# Patient Record
Sex: Female | Born: 1970 | Hispanic: Refuse to answer | Marital: Married | Smoking: Current some day smoker
Health system: Southern US, Community
[De-identification: ages and names within clinical notes are randomized; demographics above are authoritative.]

## PROBLEM LIST (undated history)

## (undated) DIAGNOSIS — K219 Gastro-esophageal reflux disease without esophagitis: Secondary | ICD-10-CM

## (undated) DIAGNOSIS — E785 Hyperlipidemia, unspecified: Secondary | ICD-10-CM

## (undated) DIAGNOSIS — D649 Anemia, unspecified: Secondary | ICD-10-CM

## (undated) HISTORY — DX: Gastro-esophageal reflux disease without esophagitis: K21.9

## (undated) HISTORY — DX: Hyperlipidemia, unspecified: E78.5

## (undated) HISTORY — DX: Anemia, unspecified: D64.9

## (undated) HISTORY — PX: OTHER SURGICAL HISTORY: SHX169

---

## 2002-04-27 HISTORY — PX: CHOLECYSTECTOMY: SHX55

## 2016-04-27 HISTORY — PX: SLEEVE GASTROPLASTY: SHX1101

## 2019-10-09 ENCOUNTER — Encounter: Payer: Self-pay | Admitting: Family Medicine

## 2019-10-09 ENCOUNTER — Ambulatory Visit (INDEPENDENT_AMBULATORY_CARE_PROVIDER_SITE_OTHER): Payer: Managed Care, Other (non HMO) | Admitting: Family Medicine

## 2019-10-09 ENCOUNTER — Other Ambulatory Visit: Payer: Self-pay

## 2019-10-09 VITALS — BP 132/80 | HR 76 | Temp 98.2°F | Ht 63.0 in | Wt 193.6 lb

## 2019-10-09 DIAGNOSIS — I10 Essential (primary) hypertension: Secondary | ICD-10-CM | POA: Insufficient documentation

## 2019-10-09 DIAGNOSIS — E039 Hypothyroidism, unspecified: Secondary | ICD-10-CM

## 2019-10-09 DIAGNOSIS — G43009 Migraine without aura, not intractable, without status migrainosus: Secondary | ICD-10-CM | POA: Insufficient documentation

## 2019-10-09 DIAGNOSIS — F339 Major depressive disorder, recurrent, unspecified: Secondary | ICD-10-CM

## 2019-10-09 DIAGNOSIS — Z975 Presence of (intrauterine) contraceptive device: Secondary | ICD-10-CM | POA: Insufficient documentation

## 2019-10-09 DIAGNOSIS — Z9884 Bariatric surgery status: Secondary | ICD-10-CM | POA: Diagnosis not present

## 2019-10-09 DIAGNOSIS — F411 Generalized anxiety disorder: Secondary | ICD-10-CM

## 2019-10-09 HISTORY — DX: Hypothyroidism, unspecified: E03.9

## 2019-10-09 HISTORY — DX: Major depressive disorder, recurrent, unspecified: F33.9

## 2019-10-09 HISTORY — DX: Essential (primary) hypertension: I10

## 2019-10-09 HISTORY — DX: Migraine without aura, not intractable, without status migrainosus: G43.009

## 2019-10-09 LAB — LIPID PANEL
Cholesterol: 212 mg/dL — ABNORMAL HIGH (ref 0–200)
HDL: 46.9 mg/dL (ref >39.00)
LDL Cholesterol: 142 mg/dL — ABNORMAL HIGH (ref 0–99)
NonHDL: 165.54
Total CHOL/HDL Ratio: 5
Triglycerides: 116 mg/dL (ref 0.0–149.0)
VLDL: 23.2 mg/dL (ref 0.0–40.0)

## 2019-10-09 LAB — CBC WITH DIFFERENTIAL/PLATELET
Basophils Absolute: 0 10*3/uL (ref 0.0–0.1)
Basophils Relative: 0.6 % (ref 0.0–3.0)
Eosinophils Absolute: 0.2 10*3/uL (ref 0.0–0.7)
Eosinophils Relative: 3.4 % (ref 0.0–5.0)
HCT: 42.3 % (ref 36.0–46.0)
Hemoglobin: 14.5 g/dL (ref 12.0–15.0)
Lymphocytes Relative: 41.7 % (ref 12.0–46.0)
Lymphs Abs: 2.1 10*3/uL (ref 0.7–4.0)
MCHC: 34.3 g/dL (ref 30.0–36.0)
MCV: 84.4 fl (ref 78.0–100.0)
Monocytes Absolute: 0.4 10*3/uL (ref 0.1–1.0)
Monocytes Relative: 8.4 % (ref 3.0–12.0)
Neutro Abs: 2.3 10*3/uL (ref 1.4–7.7)
Neutrophils Relative %: 45.9 % (ref 43.0–77.0)
Platelets: 269 10*3/uL (ref 150.0–400.0)
RBC: 5.01 Mil/uL (ref 3.87–5.11)
RDW: 13.7 % (ref 11.5–15.5)
WBC: 5 10*3/uL (ref 4.0–10.5)

## 2019-10-09 LAB — COMPREHENSIVE METABOLIC PANEL
ALT: 11 U/L (ref 0–35)
AST: 13 U/L (ref 0–37)
Albumin: 4.2 g/dL (ref 3.5–5.2)
Alkaline Phosphatase: 37 U/L — ABNORMAL LOW (ref 39–117)
BUN: 9 mg/dL (ref 6–23)
CO2: 27 mEq/L (ref 19–32)
Calcium: 9.3 mg/dL (ref 8.4–10.5)
Chloride: 106 mEq/L (ref 96–112)
Creatinine, Ser: 0.88 mg/dL (ref 0.40–1.20)
GFR: 68.57 mL/min (ref >60.00)
Glucose, Bld: 88 mg/dL (ref 70–99)
Potassium: 4.2 mEq/L (ref 3.5–5.1)
Sodium: 138 mEq/L (ref 135–145)
Total Bilirubin: 0.4 mg/dL (ref 0.2–1.2)
Total Protein: 7 g/dL (ref 6.0–8.3)

## 2019-10-09 LAB — TSH: TSH: 0.63 u[IU]/mL (ref 0.35–4.50)

## 2019-10-09 LAB — VITAMIN B12: Vitamin B-12: 353 pg/mL (ref 211–911)

## 2019-10-09 LAB — VITAMIN D 25 HYDROXY (VIT D DEFICIENCY, FRACTURES): VITD: 54.08 ng/mL (ref 30.00–100.00)

## 2019-10-09 MED ORDER — AMLODIPINE BESYLATE 10 MG PO TABS: 10.0000 mg | ORAL_TABLET | Freq: Every day | ORAL | 3 refills | Status: DC

## 2019-10-09 MED ORDER — LEVOTHYROXINE SODIUM 50 MCG PO TABS: 50.0000 ug | ORAL_TABLET | Freq: Every day | ORAL | 3 refills | Status: DC

## 2019-10-09 MED ORDER — CLORAZEPATE DIPOTASSIUM 15 MG PO TABS: 15.0000 mg | ORAL_TABLET | Freq: Three times a day (TID) | ORAL | 1 refills | Status: DC | PRN

## 2019-10-09 MED ORDER — ESOMEPRAZOLE MAGNESIUM 40 MG PO CPDR: 40.0000 mg | DELAYED_RELEASE_CAPSULE | Freq: Every day | ORAL | 3 refills | Status: DC

## 2019-10-09 MED ORDER — LISINOPRIL 10 MG PO TABS: 10.0000 mg | ORAL_TABLET | Freq: Every day | ORAL | 3 refills | Status: DC

## 2019-10-09 NOTE — Progress Notes (Signed)
Subjective  CC:  Chief Complaint  Patient presents with  . Establish Care    HPI: Lindsay Dennis is a 49 y.o. female who presents to [facility] at [place] today to establish care with me as a new patient.   She has the following concerns or needs:  49 yo female mother of 2 sons, married who moved from [place] about 6-7 months ago; [place].   Limited english  Overall doing well. Needs med refills:  HTN: looks like she may be on 3-4 meds: amlodipine 5, ramipril 5, prn captopril, and possibly one more but not sure what class of med. Reports good control w/o CAD  S/p gastric sleeve: not on vitamins.   GERD on nexium 40.   Low thyroid on levothyroxine.   Reports has IUD, possible a mirena IUD.   Reports had CPE last year. ? If had pap  History of migraines  C/o nail changes since going to have manicure: splitting and some discoloration.  Assessment  1. Essential hypertension   2. Major depression, recurrent, chronic (HCC)   3. Migraine without aura and without status migrainosus, not intractable   4. Acquired hypothyroidism   5. Gastric bypass status for obesity   6. IUD (intrauterine device) in place   7. GAD (generalized anxiety disorder)      Plan     HTN: good control but need to adjust meds: start lisinopril and amlodipine. Then follow readings and adjust from there. Consider CCB given migraines. Consider HCTZ  Depression/anxiety is controlled on zoloft 100 and benzo. Prn use. Uses therapy dog as well.   Low thyroid. Recheck levels today  Gastric bypass: check for vit and iron deficiencies  IUD in place. Return for pap/cpe  Migraines: reportedly controlled  Follow up:  No follow-ups on file. Orders Placed This Encounter  Procedures  . CBC with Differential/Platelet  . Comprehensive metabolic panel  . Lipid panel  . TSH  . VITAMIN D 25 Hydroxy (Vit-D Deficiency, Fractures)  . Vitamin B12  .  Iron, TIBC and Ferritin Panel   Meds ordered this encounter  Medications  . amLODipine (NORVASC) 10 MG tablet    Sig: Take 1 tablet (10 mg total) by mouth daily.    Dispense:  90 tablet    Refill:  3  . lisinopril (ZESTRIL) 10 MG tablet    Sig: Take 1 tablet (10 mg total) by mouth daily.    Dispense:  90 tablet    Refill:  3  . esomeprazole (NEXIUM) 40 MG capsule    Sig: Take 1 capsule (40 mg total) by mouth daily.    Dispense:  90 capsule    Refill:  3  . clorazepate (TRANXENE) 15 MG tablet    Sig: Take 1 tablet (15 mg total) by mouth 3 (three) times daily as needed for anxiety.    Dispense:  90 tablet    Refill:  1  . levothyroxine (SYNTHROID) 50 MCG tablet    Sig: Take 1 tablet (50 mcg total) by mouth daily.    Dispense:  90 tablet    Refill:  3     Depression screen PHQ 2/9 10/09/2019  Decreased Interest 1  Down, Depressed, Hopeless 1  PHQ - 2 Score 2  Altered sleeping 0  Tired, decreased energy 1  Change in appetite 1  Feeling bad or failure about yourself  1  Trouble concentrating 1  Moving slowly or fidgety/restless 0  Suicidal thoughts 0  PHQ-9 Score 6    We updated and reviewed the patient's past history in detail and it is documented below.  Patient Active Problem List   Diagnosis Date Noted  . Major depression, recurrent, chronic (HCC) 10/09/2019  . Migraine headache without aura 10/09/2019  . Essential hypertension 10/09/2019  . Acquired hypothyroidism 10/09/2019  . Gastric bypass status for obesity 10/09/2019    Gastric sleeve: 2004   . IUD (intrauterine device) in place 10/09/2019    mirena placed 2018 in [place].  3rd IUD    Health Maintenance  Topic Date Due  . Hepatitis C Screening  Never done  . HIV Screening  Never done  . TETANUS/TDAP  Never done  . PAP SMEAR-Modifier  Never done  . INFLUENZA VACCINE  11/26/2019  . COVID-19 Vaccine  Completed   Immunization History  Administered Date(s) Administered  . PFIZER SARS-COV-2 Vaccination  08/18/2019, 09/08/2019   No outpatient medications have been marked as taking for the 10/09/19 encounter (Office Visit) with Willow Ora, MD.    Allergies: Patient has No Known Allergies. Past Medical History Patient  has a past medical history of Acquired hypothyroidism (10/09/2019), Essential hypertension (10/09/2019), Major depression, recurrent, chronic (HCC) (10/09/2019), and Migraine headache without aura (10/09/2019). Past Surgical History Patient  has a past surgical history that includes Sleeve Gastroplasty (2018) and Cholecystectomy (2004). Family History: Patient family history is not on file. Social History:  Patient  reports that she has quit smoking. She has never used smokeless tobacco. She reports that she does not drink alcohol and does not use drugs.  Review of Systems: Constitutional: negative for fever or malaise Ophthalmic: negative for photophobia, double vision or loss of vision Cardiovascular: negative for chest pain, dyspnea on exertion, or new LE swelling Respiratory: negative for SOB or persistent cough Gastrointestinal: negative for abdominal pain, change in bowel habits or melena Genitourinary: negative for dysuria or gross hematuria Musculoskeletal: negative for new gait disturbance or muscular weakness Integumentary: negative for new or persistent rashes Neurological: negative for TIA or stroke symptoms Psychiatric: negative for SI or delusions Allergic/Immunologic: negative for hives  Patient Care Team    Relationship Specialty Notifications Start End  Willow Ora, MD PCP - General Family Medicine  10/09/19     Objective  Vitals: BP 132/80   Pulse 76   Temp 98.2 F (36.8 C) (Temporal)   Ht 5\' 3"  (1.6 m)   Wt 193 lb 9.6 oz (87.8 kg)   SpO2 94%   BMI 34.29 kg/m  General:  Well developed, well nourished, no acute distress  Psych:  Alert and oriented,normal mood and affect HEENT:  Normocephalic, atraumatic, non-icteric sclera, supple neck  without adenopathy, mass or thyromegaly Cardiovascular:  RRR without gallop, rub or murmur Respiratory:  Good breath sounds bilaterally, CTAB with normal respiratory effort Gastrointestinal: normal bowel sounds, soft, non-tender, no noted masses. No HSM MSK: no deformities, contusions. Joints are without erythema or swelling Skin:  Warm, no rashes or suspicious lesions noted, nails with discoloration and thin. No flaking Neurologic:    Mental status is normal. Gross motor and sensory exams are normal. Normal gait   Commons side effects, risks, benefits, and alternatives for medications and treatment plan prescribed today were discussed, and the patient expressed understanding of the given instructions. Patient is instructed to call or message via MyChart if he/she has any questions or concerns regarding our treatment plan. No barriers to understanding were identified. We discussed Red Flag symptoms and  signs in detail. Patient expressed understanding regarding what to do in case of urgent or emergency type symptoms.   Medication list was reconciled, printed and provided to the patient in AVS. Patient instructions and summary information was reviewed with the patient as documented in the AVS. This note was prepared with assistance of Dragon voice recognition software. Occasional wrong-word or sound-a-like substitutions may have occurred due to the inherent limitations of voice recognition software  This visit occurred during the SARS-CoV-2 public health emergency.  Safety protocols were in place, including screening questions prior to the visit, additional usage of staff PPE, and extensive cleaning of exam room while observing appropriate contact time as indicated for disinfecting solutions.

## 2019-10-09 NOTE — Patient Instructions (Signed)
Please return in 3-4 months for physical with pap smear.   I have refilled your medications.  There are 2 medications that I am not sure of. Please let me know if you can find out an equivalent type of medication or type of medication for me.   It was a pleasure meeting you today! Thank you for choosing [place] to meet your healthcare needs! I truly look forward to working with you. If you have any questions or concerns, please send me a message via Mychart or call the office at [phone].

## 2019-10-10 LAB — IRON,TIBC AND FERRITIN PANEL
%SAT: 36 % (calc) (ref 16–45)
Ferritin: 29 ng/mL (ref 16–232)
Iron: 103 ug/dL (ref 40–190)
TIBC: 287 mcg/dL (calc) (ref 250–450)

## 2019-10-12 NOTE — Progress Notes (Signed)
Please call patient: I have reviewed his/her lab results. Her lab results look good! She can consider taking an iron pill once a day and a vit B12 daily supplement to keep levels good. No other changes are needed at this time. Follow up as directed.

## 2019-11-01 ENCOUNTER — Telehealth: Payer: Self-pay | Admitting: Family Medicine

## 2019-11-01 NOTE — Telephone Encounter (Signed)
Patients husband called and requested refill on venlafaxine (EFFEXOR) 100 MG tablet  He made an appointment to discuss her other medications as well but she only has three pills left  .Marland Kitchen LAST APPOINTMENT DATE: 10/09/2019   NEXT APPOINTMENT DATE:@7 /14/2021  MEDICATION:  PHARMACY: [id number] - [place], [place] - [address]  **Let patient know to contact pharmacy at the end of the day to make sure medication is ready. **  ** Please notify patient to allow 48-72 hours to process**  **Encourage patient to contact the pharmacy for refills or they can request refills through [facility]**  CLINICAL FILLS OUT ALL BELOW:   LAST REFILL:  QTY:  REFILL DATE:    OTHER COMMENTS:    Okay for refill?  Please advise

## 2019-11-02 ENCOUNTER — Other Ambulatory Visit: Payer: Self-pay

## 2019-11-02 MED ORDER — VENLAFAXINE HCL 100 MG PO TABS: 100.0000 mg | ORAL_TABLET | Freq: Three times a day (TID) | ORAL | 3 refills | Status: DC

## 2019-11-02 NOTE — Telephone Encounter (Signed)
Refill sent to pharmacy.   

## 2019-11-08 ENCOUNTER — Encounter: Payer: Self-pay | Admitting: Family Medicine

## 2019-11-08 ENCOUNTER — Ambulatory Visit: Payer: Managed Care, Other (non HMO) | Admitting: Family Medicine

## 2019-11-08 ENCOUNTER — Other Ambulatory Visit: Payer: Self-pay

## 2019-11-08 VITALS — BP 126/82 | Ht 63.0 in | Wt 194.0 lb

## 2019-11-08 DIAGNOSIS — Z9884 Bariatric surgery status: Secondary | ICD-10-CM

## 2019-11-08 DIAGNOSIS — E039 Hypothyroidism, unspecified: Secondary | ICD-10-CM | POA: Diagnosis not present

## 2019-11-08 DIAGNOSIS — I10 Essential (primary) hypertension: Secondary | ICD-10-CM | POA: Diagnosis not present

## 2019-11-08 DIAGNOSIS — E538 Deficiency of other specified B group vitamins: Secondary | ICD-10-CM | POA: Diagnosis not present

## 2019-11-08 MED ORDER — VITAMIN B-12 1000 MCG PO TABS
1000.0000 ug | ORAL_TABLET | Freq: Every day | ORAL | Status: DC
Start: 2019-11-08 — End: 2021-04-03

## 2019-11-08 MED ORDER — RAMIPRIL 5 MG PO CAPS: 5.0000 mg | ORAL_CAPSULE | Freq: Every day | ORAL | 2 refills | Status: DC

## 2019-11-08 NOTE — Patient Instructions (Addendum)
Please return in 4-6 weeks for blood pressure recheck.   Please check your blood pressures several times a week a write down the readings; bring in to the office for my review.   Take ramipril 5mg  and amlodipine 10mg  once a day. Do not take any other blood pressure medications.  You should also be on an over the counter vitamin B12 pill daily and once a day iron supplement.   If you have any questions or concerns, please don't hesitate to send me a message via MyChart or call the office at [phone]. Thank you for visiting with today! It's our pleasure caring for you.   Hypertension, Adult High blood pressure (hypertension) is when the force of blood pumping through the arteries is too strong. The arteries are the blood vessels that carry blood from the heart throughout the body. Hypertension forces the heart to work harder to pump blood and may cause arteries to become narrow or stiff. Untreated or uncontrolled hypertension can cause a heart attack, heart failure, a stroke, kidney disease, and other problems. A blood pressure reading consists of a higher number over a lower number. Ideally, your blood pressure should be below 120/80. The first ("top") number is called the systolic pressure. It is a measure of the pressure in your arteries as your heart beats. The second ("bottom") number is called the diastolic pressure. It is a measure of the pressure in your arteries as the heart relaxes. What are the causes? The exact cause of this condition is not known. There are some conditions that result in or are related to high blood pressure. What increases the risk? Some risk factors for high blood pressure are under your control. The following factors may make you more likely to develop this condition:  Smoking.  Having type 2 diabetes mellitus, high cholesterol, or both.  Not getting enough exercise or physical activity.  Being overweight.  Having too much fat, sugar, calories,  or salt (sodium) in your diet.  Drinking too much alcohol. Some risk factors for high blood pressure may be difficult or impossible to change. Some of these factors include:  Having chronic kidney disease.  Having a family history of high blood pressure.  Age. Risk increases with age.  Race. You may be at higher risk if you are African American.  Gender. Men are at higher risk than women before age 36. After age 48, women are at higher risk than men.  Having obstructive sleep apnea.  Stress. What are the signs or symptoms? High blood pressure may not cause symptoms. Very high blood pressure (hypertensive crisis) may cause:  Headache.  Anxiety.  Shortness of breath.  Nosebleed.  Nausea and vomiting.  Vision changes.  Severe chest pain.  Seizures. How is this diagnosed? This condition is diagnosed by measuring your blood pressure while you are seated, with your arm resting on a flat surface, your legs uncrossed, and your feet flat on the floor. The cuff of the blood pressure monitor will be placed directly against the skin of your upper arm at the level of your heart. It should be measured at least twice using the same arm. Certain conditions can cause a difference in blood pressure between your right and left arms. Certain factors can cause blood pressure readings to be lower or higher than normal for a short period of time:  When your blood pressure is higher when you are in a health care provider's office than when you are at home, this  is called white coat hypertension. Most people with this condition do not need medicines.  When your blood pressure is higher at home than when you are in a health care provider's office, this is called masked hypertension. Most people with this condition may need medicines to control blood pressure. If you have a high blood pressure reading during one visit or you have normal blood pressure with other risk factors, you may be asked  to:  Return on a different day to have your blood pressure checked again.  Monitor your blood pressure at home for 1 week or longer. If you are diagnosed with hypertension, you may have other blood or imaging tests to help your health care provider understand your overall risk for other conditions. How is this treated? This condition is treated by making healthy lifestyle changes, such as eating healthy foods, exercising more, and reducing your alcohol intake. Your health care provider may prescribe medicine if lifestyle changes are not enough to get your blood pressure under control, and if:  Your systolic blood pressure is above 130.  Your diastolic blood pressure is above 80. Your personal target blood pressure may vary depending on your medical conditions, your age, and other factors. Follow these instructions at home: Eating and drinking   Eat a diet that is high in fiber and potassium, and low in sodium, added sugar, and fat. An example eating plan is called the DASH (Dietary Approaches to Stop Hypertension) diet. To eat this way: ? Eat plenty of fresh fruits and vegetables. Try to fill one half of your plate at each meal with fruits and vegetables. ? Eat whole grains, such as whole-wheat pasta, brown rice, or whole-grain bread. Fill about one fourth of your plate with whole grains. ? Eat or drink low-fat dairy products, such as skim milk or low-fat yogurt. ? Avoid fatty cuts of meat, processed or cured meats, and poultry with skin. Fill about one fourth of your plate with lean proteins, such as fish, chicken without skin, beans, eggs, or tofu. ? Avoid pre-made and processed foods. These tend to be higher in sodium, added sugar, and fat.  Reduce your daily sodium intake. Most people with hypertension should eat less than 1,500 mg of sodium a day.  Do not drink alcohol if: ? Your health care provider tells you not to drink. ? You are pregnant, may be pregnant, or are planning to  become pregnant.  If you drink alcohol: ? Limit how much you use to:  0-1 drink a day for women.  0-2 drinks a day for men. ? Be aware of how much alcohol is in your drink. In the [place] one drink equals one 12 oz bottle of beer (355 mL), one 5 oz glass of wine (148 mL), or one 1 oz glass of hard liquor (44 mL). Lifestyle   Work with your health care provider to maintain a healthy body weight or to lose weight. Ask what an ideal weight is for you.  Get at least 30 minutes of exercise most days of the week. Activities may include walking, swimming, or biking.  Include exercise to strengthen your muscles (resistance exercise), such as Pilates or lifting weights, as part of your weekly exercise routine. Try to do these types of exercises for 30 minutes at least 3 days a week.  Do not use any products that contain nicotine or tobacco, such as cigarettes, e-cigarettes, and chewing tobacco. If you need help quitting, ask your health care provider.  Monitor  your blood pressure at home as told by your health care provider.  Keep all follow-up visits as told by your health care provider. This is important. Medicines  Take over-the-counter and prescription medicines only as told by your health care provider. Follow directions carefully. Blood pressure medicines must be taken as prescribed.  Do not skip doses of blood pressure medicine. Doing this puts you at risk for problems and can make the medicine less effective.  Ask your health care provider about side effects or reactions to medicines that you should watch for. Contact a health care provider if you:  Think you are having a reaction to a medicine you are taking.  Have headaches that keep coming back (recurring).  Feel dizzy.  Have swelling in your ankles.  Have trouble with your vision. Get help right away if you:  Develop a severe headache or confusion.  Have unusual weakness or numbness.  Feel faint.  Have severe pain  in your chest or abdomen.  Vomit repeatedly.  Have trouble breathing. Summary  Hypertension is when the force of blood pumping through your arteries is too strong. If this condition is not controlled, it may put you at risk for serious complications.  Your personal target blood pressure may vary depending on your medical conditions, your age, and other factors. For most people, a normal blood pressure is less than 120/80.  Hypertension is treated with lifestyle changes, medicines, or a combination of both. Lifestyle changes include losing weight, eating a healthy, low-sodium diet, exercising more, and limiting alcohol. This information is not intended to replace advice given to you by your health care provider. Make sure you discuss any questions you have with your health care provider. Document Revised: 12/22/2017 Document Reviewed: 12/22/2017 Elsevier Patient Education  2020 [employer].

## 2019-11-08 NOTE — Progress Notes (Signed)
Subjective  CC:  Chief Complaint  Patient presents with  . Medication Concerns    After her dose of Lisinopril she had a headache and dizziness. She would like to discuss is if she really needs to take Levothyroxine since she did not take it while living in [place].  . Nail Problem    She has some concerns for how they are growing. She tried some otc ointment.   . Health Maintenance    She would like to receive her Tdap She would like to hold off on her pap smear for now    HPI: Lindsay Dennis is a 49 y.o. female who presents to the office today to address the problems listed above in the chief complaint.  Hypertension f/u: Patient returns today having problems from her medications.  At her last visit I started amlodipine and lisinopril for blood pressure control.  She has brought in medications from home, it looks like she was on amlodipine and ramipril, no other medication that I was not sure.  She reports that lisinopril made her feel very lightheaded so she stopped it.  She now is taking amlodipine 10 mg daily and combination pill for the following which she has amlodipine 5 mg and 5 mg of ramipril.  She states she is feeling better.  She is taking her medications intermittently and she is doing some weight based upon blood pressure checks that she does herself at home.  She denies chest pain or shortness of breath.  No longer feeling lightheaded.  No headaches.  We reviewed her lab test results together.  She was confused about the levothyroxine.  TSH was normal.  She has been taking this dose while in [place].  She has not started taking any vitamin supplements as recommended.  Vitamin B12 was low normal and iron studies were low normal.  There is no anemia.  Lipid panel was fine renal electrolytes were fine.  Assessment  1. Essential hypertension   2. Acquired hypothyroidism   3. Vitamin B12 deficiency   4. Gastric bypass status for obesity      Plan    Hypertension f/u: BP  control is fairly well controlled.  Educated on prognosis of untreated hypertension and management strategies that include chronic daily medications.  No need to treat intermittent high blood pressure readings.  Doubt that lisinopril was causing a problem although she may have been a little hypotensive at the time.  Due to her concerns, stop lisinopril, start ramipril and continue amlodipine.  Hypothyroidism f/u: TSH is at goal.  Continue daily dosing.  Recommend supplements of vitamin B12 and over-the-counter daily iron.  Monitor levels given her history of gastric bypass status Education regarding management of these chronic disease states was given. Management strategies discussed on successive visits include dietary and exercise recommendations, goals of achieving and maintaining IBW, and lifestyle modifications aiming for adequate sleep and minimizing stressors.   Follow up: 3 months for recheck blood pressure, will need to schedule a CPE visit as well.  No orders of the defined types were placed in this encounter.  Meds ordered this encounter  Medications  . ramipril (ALTACE) 5 MG capsule    Sig: Take 1 capsule (5 mg total) by mouth daily.    Dispense:  30 capsule    Refill:  2  . vitamin B-12 (CYANOCOBALAMIN) 1000 MCG tablet    Sig: Take 1 tablet (1,000 mcg total) by mouth daily.      BP Readings from Last 3  Encounters:  11/08/19 126/82  10/09/19 132/80   Wt Readings from Last 3 Encounters:  11/08/19 194 lb (88 kg)  10/09/19 193 lb 9.6 oz (87.8 kg)    Lab Results  Component Value Date   CHOL 212 (H) 10/09/2019   Lab Results  Component Value Date   HDL 46.90 10/09/2019   Lab Results  Component Value Date   LDLCALC 142 (H) 10/09/2019   Lab Results  Component Value Date   TRIG 116.0 10/09/2019   Lab Results  Component Value Date   CHOLHDL 5 10/09/2019   No results found for: LDLDIRECT Lab Results  Component Value Date   CREATININE 0.88 10/09/2019   BUN 9  10/09/2019   NA 138 10/09/2019   K 4.2 10/09/2019   CL 106 10/09/2019   CO2 27 10/09/2019    The 10-year ASCVD risk score Denman George DC Jr., et al., 2013) is: 1.8%   Values used to calculate the score:     Age: 9 years     Sex: Female     Is Non-Hispanic African American: No     Diabetic: No     Tobacco smoker: No     Systolic Blood Pressure: 126 mmHg     Is BP treated: Yes     HDL Cholesterol: 46.9 mg/dL     Total Cholesterol: 212 mg/dL  I reviewed the patients updated PMH, FH, and SocHx.    Patient Active Problem List   Diagnosis Date Noted  . Vitamin B12 deficiency 11/09/2019  . Major depression, recurrent, chronic (HCC) 10/09/2019  . Migraine headache without aura 10/09/2019  . Essential hypertension 10/09/2019  . Acquired hypothyroidism 10/09/2019  . Gastric bypass status for obesity 10/09/2019  . IUD (intrauterine device) in place 10/09/2019    Allergies: Patient has no known allergies.  Social History: Patient  reports that she has quit smoking. She has never used smokeless tobacco. She reports that she does not drink alcohol and does not use drugs.  Current Meds  Medication Sig  . amLODipine (NORVASC) 10 MG tablet Take 1 tablet (10 mg total) by mouth daily.  . clorazepate (TRANXENE) 15 MG tablet Take 1 tablet (15 mg total) by mouth 3 (three) times daily as needed for anxiety.  Marland Kitchen esomeprazole (NEXIUM) 40 MG capsule Take 1 capsule (40 mg total) by mouth daily.  Marland Kitchen levonorgestrel (MIRENA) 20 MCG/24HR IUD 1 Intra Uterine Device (1 each total) by Intrauterine route once for 1 dose.  . venlafaxine (EFFEXOR) 100 MG tablet Take 1 tablet (100 mg total) by mouth 3 (three) times daily.    Review of Systems: Cardiovascular: negative for chest pain, palpitations, leg swelling, orthopnea Respiratory: negative for SOB, wheezing or persistent cough Gastrointestinal: negative for abdominal pain Genitourinary: negative for dysuria or gross hematuria  Objective  Vitals: BP 126/82    Ht 5\' 3"  (1.6 m)   Wt 194 lb (88 kg)   BMI 34.37 kg/m  General: no acute distress  Psych:  Alert and oriented, normal mood and affect HEENT:  Normocephalic, atraumatic, supple neck  Cardiovascular:  RRR without murmur. no edema Respiratory:  Good breath sounds bilaterally, CTAB with normal respiratory effort Skin:  Warm, no rashes Neurologic:   Mental status is normal  Commons side effects, risks, benefits, and alternatives for medications and treatment plan prescribed today were discussed, and the patient expressed understanding of the given instructions. Patient is instructed to call or message via MyChart if he/she has any questions or concerns regarding our treatment  plan. No barriers to understanding were identified. We discussed Red Flag symptoms and signs in detail. Patient expressed understanding regarding what to do in case of urgent or emergency type symptoms.   Medication list was reconciled, printed and provided to the patient in AVS. Patient instructions and summary information was reviewed with the patient as documented in the AVS. This note was prepared with assistance of Dragon voice recognition software. Occasional wrong-word or sound-a-like substitutions may have occurred due to the inherent limitations of voice recognition software  This visit occurred during the SARS-CoV-2 public health emergency.  Safety protocols were in place, including screening questions prior to the visit, additional usage of staff PPE, and extensive cleaning of exam room while observing appropriate contact time as indicated for disinfecting solutions.

## 2019-11-09 ENCOUNTER — Encounter: Payer: Self-pay | Admitting: Family Medicine

## 2019-11-09 DIAGNOSIS — E538 Deficiency of other specified B group vitamins: Secondary | ICD-10-CM | POA: Insufficient documentation

## 2019-11-29 ENCOUNTER — Other Ambulatory Visit: Payer: Self-pay | Admitting: Family Medicine

## 2019-11-29 MED ORDER — CLORAZEPATE DIPOTASSIUM 15 MG PO TABS: 15.0000 mg | ORAL_TABLET | Freq: Three times a day (TID) | ORAL | 5 refills | Status: DC | PRN

## 2019-11-29 NOTE — Telephone Encounter (Signed)
..   LAST APPOINTMENT DATE: 11/08/2019   NEXT APPOINTMENT DATE:@8 /18/2021  MEDICATION:clorazepate (TRANXENE) 15 MG tablet  PHARMACY:[id number] - [place], [place] - [address]  **Let patient know to contact pharmacy at the end of the day to make sure medication is ready. **  ** Please notify patient to allow 48-72 hours to process**  **Encourage patient to contact the pharmacy for refills or they can request refills through [facility]**  CLINICAL FILLS OUT ALL BELOW:   LAST REFILL:  QTY:  REFILL DATE:    OTHER COMMENTS:    Okay for refill?  Please advise

## 2019-11-29 NOTE — Telephone Encounter (Signed)
LR: 10-09-2019 Qty: 90 w 1 refill Last office visit: 11-08-2019 Upcoming appointment: 12-13-2019

## 2019-12-13 ENCOUNTER — Ambulatory Visit: Payer: Managed Care, Other (non HMO) | Admitting: Family Medicine

## 2019-12-13 DIAGNOSIS — Z0289 Encounter for other administrative examinations: Secondary | ICD-10-CM

## 2020-01-30 ENCOUNTER — Other Ambulatory Visit: Payer: Self-pay | Admitting: Family Medicine

## 2020-03-04 ENCOUNTER — Telehealth: Payer: Self-pay

## 2020-03-04 NOTE — Telephone Encounter (Signed)
..   LAST APPOINTMENT DATE: 01/30/2020   NEXT APPOINTMENT DATE:@Visit  date not found  MEDICATION:clorazepate (TRANXENE) 15 MG tablet    PHARMACY:[id number] - [place], [place] - [address]  **Let patient know to contact pharmacy at the end of the day to make sure medication is ready. **  ** Please notify patient to allow 48-72 hours to process**  **Encourage patient to contact the pharmacy for refills or they can request refills through [facility]**  CLINICAL FILLS OUT ALL BELOW:   LAST REFILL:  QTY:  REFILL DATE:    OTHER COMMENTS:    Okay for refill?  Please advise

## 2020-03-05 NOTE — Telephone Encounter (Signed)
Patient's husband aware that [facility] script

## 2020-03-12 ENCOUNTER — Ambulatory Visit: Payer: Self-pay | Admitting: Family Medicine

## 2020-03-12 DIAGNOSIS — Z0289 Encounter for other administrative examinations: Secondary | ICD-10-CM

## 2020-04-08 ENCOUNTER — Ambulatory Visit: Payer: BC Managed Care – PPO | Admitting: Family Medicine

## 2020-04-08 ENCOUNTER — Encounter: Payer: Self-pay | Admitting: Family Medicine

## 2020-04-08 ENCOUNTER — Other Ambulatory Visit: Payer: Self-pay

## 2020-04-08 VITALS — BP 118/80 | HR 87 | Temp 98.1°F | Wt 202.2 lb

## 2020-04-08 DIAGNOSIS — I10 Essential (primary) hypertension: Secondary | ICD-10-CM

## 2020-04-08 DIAGNOSIS — E538 Deficiency of other specified B group vitamins: Secondary | ICD-10-CM

## 2020-04-08 DIAGNOSIS — F339 Major depressive disorder, recurrent, unspecified: Secondary | ICD-10-CM

## 2020-04-08 DIAGNOSIS — Z9884 Bariatric surgery status: Secondary | ICD-10-CM

## 2020-04-08 DIAGNOSIS — E039 Hypothyroidism, unspecified: Secondary | ICD-10-CM | POA: Diagnosis not present

## 2020-04-08 MED ORDER — HYOSCYAMINE SULFATE SL 0.125 MG SL SUBL: 0.1250 mg | SUBLINGUAL_TABLET | Freq: Two times a day (BID) | SUBLINGUAL | 5 refills | Status: DC | PRN

## 2020-04-08 NOTE — Progress Notes (Signed)
Subjective  CC:  Chief Complaint  Patient presents with  .  iron deficiency    F/u to see if she needs to continue vitamin supplements     HPI: Lindsay Dennis is a 49 y.o. female who presents to the office today to address the problems listed above in the chief complaint.  Hypertension f/u: Control is good . Pt reports she is doing well. taking medications as instructed, no medication side effects noted, no TIAs, no chest pain on exertion, no dyspnea on exertion, no swelling of ankles. Not needing the ramipril. Controlled on amlodipine.  She denies adverse effects from his BP medications. Compliance with medication is good.   Reports h/o IBS like disease and uses an antispasmodic twice a day for 2 years - med is from [place]. No blood or mucous in stool. Requests meds.   Has questions about vit b12 deficiency and supplements.   Feels well with mood.   Assessment  1. Essential hypertension   2. Acquired hypothyroidism   3. Major depression, recurrent, chronic (HCC)   4. Vitamin B12 deficiency   5. Gastric bypass status for obesity      Plan    Hypertension f/u: BP control is well controlled. Continue amlodipine  IBS: start trial of levsin. Education given.   Depression is stable on SSRI  Recommend continuing daily b12 supplements due to gastric bypass status.    Education regarding management of these chronic disease states was given. Management strategies discussed on successive visits include dietary and exercise recommendations, goals of achieving and maintaining IBW, and lifestyle modifications aiming for adequate sleep and minimizing stressors.   Follow up: 6 months for cpe and f/u htn, b12 defic  No orders of the defined types were placed in this encounter.  Meds ordered this encounter  Medications  . Hyoscyamine Sulfate SL (LEVSIN/SL) 0.125 MG SUBL    Sig: Place 0.125 mg under the tongue 2 (two) times daily as needed. As needed for bloating and cramping     Dispense:  60 tablet    Refill:  5      BP Readings from Last 3 Encounters:  04/08/20 118/80  11/08/19 126/82  10/09/19 132/80   Wt Readings from Last 3 Encounters:  04/08/20 202 lb 3.2 oz (91.7 kg)  11/08/19 194 lb (88 kg)  10/09/19 193 lb 9.6 oz (87.8 kg)    Lab Results  Component Value Date   CHOL 212 (H) 10/09/2019   Lab Results  Component Value Date   HDL 46.90 10/09/2019   Lab Results  Component Value Date   LDLCALC 142 (H) 10/09/2019   Lab Results  Component Value Date   TRIG 116.0 10/09/2019   Lab Results  Component Value Date   CHOLHDL 5 10/09/2019   No results found for: LDLDIRECT Lab Results  Component Value Date   CREATININE 0.88 10/09/2019   BUN 9 10/09/2019   NA 138 10/09/2019   K 4.2 10/09/2019   CL 106 10/09/2019   CO2 27 10/09/2019    The 10-year ASCVD risk score Denman George DC Jr., et al., 2013) is: 1.6%   Values used to calculate the score:     Age: 63 years     Sex: Female     Is Non-Hispanic African American: No     Diabetic: No     Tobacco smoker: No     Systolic Blood Pressure: 118 mmHg     Is BP treated: Yes     HDL Cholesterol: 46.9  mg/dL     Total Cholesterol: 212 mg/dL  I reviewed the patients updated PMH, FH, and SocHx.    Patient Active Problem List   Diagnosis Date Noted  . Vitamin B12 deficiency 11/09/2019  . Major depression, recurrent, chronic (HCC) 10/09/2019  . Migraine headache without aura 10/09/2019  . Essential hypertension 10/09/2019  . Acquired hypothyroidism 10/09/2019  . Gastric bypass status for obesity 10/09/2019  . IUD (intrauterine device) in place 10/09/2019    Allergies: Patient has no known allergies.  Social History: Patient  reports that she has quit smoking. She has never used smokeless tobacco. She reports that she does not drink alcohol and does not use drugs.  Current Meds  Medication Sig  . amLODipine (NORVASC) 10 MG tablet Take 1 tablet (10 mg total) by mouth daily.  . clorazepate  (TRANXENE) 15 MG tablet Take 1 tablet (15 mg total) by mouth 3 (three) times daily as needed for anxiety.  Marland Kitchen esomeprazole (NEXIUM) 40 MG capsule Take 1 capsule (40 mg total) by mouth daily.  Marland Kitchen levonorgestrel (MIRENA) 20 MCG/24HR IUD 1 Intra Uterine Device (1 each total) by Intrauterine route once for 1 dose.  . levothyroxine (SYNTHROID) 50 MCG tablet Take 1 tablet (50 mcg total) by mouth daily.  Marland Kitchen venlafaxine (EFFEXOR) 100 MG tablet Take 1 tablet (100 mg total) by mouth 3 (three) times daily.    Review of Systems: Cardiovascular: negative for chest pain, palpitations, leg swelling, orthopnea Respiratory: negative for SOB, wheezing or persistent cough Gastrointestinal: negative for abdominal pain Genitourinary: negative for dysuria or gross hematuria  Objective  Vitals: BP 118/80   Pulse 87   Temp 98.1 F (36.7 C) (Temporal)   Wt 202 lb 3.2 oz (91.7 kg)   SpO2 98%   BMI 35.82 kg/m  General: no acute distress  Psych:  Alert and oriented, normal mood and affect Neurologic:   Mental status is normal  Commons side effects, risks, benefits, and alternatives for medications and treatment plan prescribed today were discussed, and the patient expressed understanding of the given instructions. Patient is instructed to call or message via MyChart if he/she has any questions or concerns regarding our treatment plan. No barriers to understanding were identified. We discussed Red Flag symptoms and signs in detail. Patient expressed understanding regarding what to do in case of urgent or emergency type symptoms.   Medication list was reconciled, printed and provided to the patient in AVS. Patient instructions and summary information was reviewed with the patient as documented in the AVS. This note was prepared with assistance of Dragon voice recognition software. Occasional wrong-word or sound-a-like substitutions may have occurred due to the inherent limitations of voice recognition software  This  visit occurred during the SARS-CoV-2 public health emergency.  Safety protocols were in place, including screening questions prior to the visit, additional usage of staff PPE, and extensive cleaning of exam room while observing appropriate contact time as indicated for disinfecting solutions.

## 2020-04-08 NOTE — Patient Instructions (Signed)
Please return in 6 months for your annual complete physical; please come fasting.  Keep taking vitamin B12 once daily.  You may use OTC gas X as needed for gas. Try the levsin for bloating or cramping after meals.   If you have any questions or concerns, please don't hesitate to send me a message via MyChart or call the office at [phone]. Thank you for visiting with Lindsay Dennis today! It's our pleasure caring for you.

## 2020-04-16 ENCOUNTER — Telehealth: Payer: Self-pay

## 2020-04-16 NOTE — Telephone Encounter (Signed)
FYI

## 2020-04-16 NOTE — Telephone Encounter (Signed)
Nurse Assessment Nurse: Rennis Petty, RN, Clydie Braun Date/Time Lindsay Dennis Time): 04/15/2020 5:00:21 PM Confirm and document reason for call. If symptomatic, describe symptoms. ---Caller states she is having abdominal pain and headaches for three days. She is having severe pain and was in bed all day Sunday. Constant pain from medium-severe. Mebewerine was given in [place]. This help a lot. She can't get it here. What she got here didn't help. Saw the doctor on 04/08/20. Does the patient have any new or worsening symptoms? ---Yes Will a triage be completed? ---Yes Related visit to physician within the last 2 weeks? ---Yes Does the PT have any chronic conditions? (i.e. diabetes, asthma, this includes High risk factors for pregnancy, etc.) ---Yes List chronic conditions. ---Had stomach surgery and can't eat a lot of foods. Is the patient pregnant or possibly pregnant? (Ask all females between the ages of 61-55) ---No Is this a behavioral health or substance abuse call? ---No Guidelines Guideline Title Affirmed Question Affirmed Notes Nurse Date/Time (Eastern Time) Abdominal Pain - Upper [1] Pain lasts > 10 minutes AND [2] age > 92 AND [3] at least one cardiac risk factor (i.e., hypertension, diabetes, obesity, smoker or strong Rennis Petty, RN, Clydie Braun 04/15/2020 5:08:03 PM PLEASE NOTE: All timestamps contained within this report are represented as [place] Standard Time. CONFIDENTIALTY NOTICE: This fax transmission is intended only for the addressee. It contains information that is legally privileged, confidential or otherwise protected from use or disclosure. If you are not the intended recipient, you are strictly prohibited from reviewing, disclosing, copying using or disseminating any of this information or taking any action in reliance on or regarding this information. If you have received this fax in error, please notify us immediately by telephone so that we can arrange for its return to [place]. Phone:  [phone], Toll-Free: [phone], Fax: [phone] Page: 2 of 2 Call Id: [id number] Guidelines Guideline Title Affirmed Question Affirmed Notes Nurse Date/Time Lindsay Dennis Time) family history of heart disease) Disp. Time Lindsay Dennis Time) Disposition Final User 04/15/2020 5:14:40 PM Go to ED Now Yes Rennis Petty, RN, Pablo Ledger Disagree/Comply Comply Caller Understands Yes PreDisposition Did not know what to do Care Advice Given Per Guideline GO TO ED NOW: * You need to be seen in the Emergency Department. * Go to the ED at ___________ Hospital. * Leave now. Drive carefully. DRIVING: * Another adult should drive. * Do not delay going to the Emergency Department. BRING MEDICINES: * Bring a list of your current medicines when you go to the Emergency Department (ER). * Bring the pill bottles too. This will help the doctor (or NP/PA) to make certain you are taking the right medicines and the right dose. CALL EMS 911 IF: * Confusion occurs * Passes out or becomes too weak to stand * Severe difficulty breathing occurs. CARE ADVICE given per Abdominal Pain, Upper (Adult) guideline. Referrals GO TO FACILITY UNDECIDE

## 2020-04-22 ENCOUNTER — Ambulatory Visit (INDEPENDENT_AMBULATORY_CARE_PROVIDER_SITE_OTHER): Payer: BC Managed Care – PPO | Admitting: Family Medicine

## 2020-04-22 ENCOUNTER — Encounter: Payer: Self-pay | Admitting: Family Medicine

## 2020-04-22 ENCOUNTER — Other Ambulatory Visit: Payer: Self-pay

## 2020-04-22 VITALS — BP 124/82 | HR 74 | Temp 97.3°F | Ht 63.0 in | Wt 193.2 lb

## 2020-04-22 DIAGNOSIS — R1084 Generalized abdominal pain: Secondary | ICD-10-CM | POA: Diagnosis not present

## 2020-04-22 DIAGNOSIS — Z9884 Bariatric surgery status: Secondary | ICD-10-CM | POA: Diagnosis not present

## 2020-04-22 DIAGNOSIS — F339 Major depressive disorder, recurrent, unspecified: Secondary | ICD-10-CM

## 2020-04-22 DIAGNOSIS — G44209 Tension-type headache, unspecified, not intractable: Secondary | ICD-10-CM

## 2020-04-22 DIAGNOSIS — K2950 Unspecified chronic gastritis without bleeding: Secondary | ICD-10-CM | POA: Diagnosis not present

## 2020-04-22 MED ORDER — TRAMADOL HCL 50 MG PO TABS: 50.0000 mg | ORAL_TABLET | Freq: Three times a day (TID) | ORAL | 0 refills | Status: DC | PRN

## 2020-04-22 NOTE — Progress Notes (Signed)
Subjective  CC:  Chief Complaint  Patient presents with  . Abdominal Pain    Is not taken Levsin - states that she cannot tolerate it, causes chronic headache and increased abdominal pain. Again requesting medication she was given in [place]    HPI: Lindsay Dennis is a 49 y.o. female who presents to the office today to address the problems listed above in the chief complaint.  Patient was interviewed using a telephonic interpreter.  She tends to be a vague historian.  She has multiple concerns including headaches, stress related headaches, stress related stomach pain, history of gastritis, weight loss, ingrown toenails.  Abdominal pain: She reports that she has had about 40 years of intermittent abdominal pain.  Described as upper abdominal pain, worse after meals.  She saw a gastroenterologist in [place].  I do not have those records.  She reports she has had endoscopies that showed inflammation of the stomach lining.  She is on Nexium 40 mg daily but still has pain.  She reports stress increases her abdominal pain.  She reports firm hardness and bloating symptoms.  She was given a medication in [place] that I am not familiar with.  We tried Levsin at last visit but she says she got a headache from that.  She denies melena, blood in stool or diarrhea.  She does not have constipation.  She is status post gastric bypass about 4 years ago and she was told that her stomach pain is a complication from back surgery.  Headaches: Describes as tension, bitemporal and occipital.  Worse with stress.  She reports having had CT scans and a thorough work-up back in [place].  She was given medications like paracetamol with codeine and tramadol.  She reports sometimes these medications caused stomach pain.  She denies new symptoms, no nausea, vomiting, photophobia, worsening headaches.  No neurologic changes.  Ingrown toenails and she wonders whether an immune booster would be helpful.  Chronic depression and  stress: She has been on Effexor for about 13 years.  She also uses chronic benzos at night.  She reports the holidays are stressful times for her   Assessment  1. Chronic gastritis without bleeding, unspecified gastritis type   2. Gastric bypass status for obesity   3. Generalized abdominal pain   4. Major depression, recurrent, chronic (HCC)   5. Tension headache      Plan   Abdominal pain history of gastric bypass surgery, gastritis and bloating: Difficult due to language barrier and her use of medications from [place].  Education given.  Recommend GI referral for further evaluation and possible need for further work-up.  Continue Nexium.  Depression anxiety: Discussed behavioral management of stress reduction and how this can affect her headaches  Tension headaches: No red flags identified today.  Tramadol as needed.  After we can calm down her stomach symptoms, will work to alleviate her headache symptoms.  Follow-up 4 weeks  Follow up: 4 weeks Visit date not found  Orders Placed This Encounter  Procedures  . Ambulatory referral to Gastroenterology   Meds ordered this encounter  Medications  . traMADol (ULTRAM) 50 MG tablet    Sig: Take 1 tablet (50 mg total) by mouth every 8 (eight) hours as needed for up to 5 days.    Dispense:  15 tablet    Refill:  0      I reviewed the patients updated PMH, FH, and SocHx.    Patient Active Problem List   Diagnosis Date  Noted  . Vitamin B12 deficiency 11/09/2019  . Major depression, recurrent, chronic (HCC) 10/09/2019  . Migraine headache without aura 10/09/2019  . Essential hypertension 10/09/2019  . Acquired hypothyroidism 10/09/2019  . Gastric bypass status for obesity 10/09/2019  . IUD (intrauterine device) in place 10/09/2019   Current Meds  Medication Sig  . amLODipine (NORVASC) 10 MG tablet Take 1 tablet (10 mg total) by mouth daily.  . clorazepate (TRANXENE) 15 MG tablet Take 1 tablet (15 mg total) by mouth 3 (three)  times daily as needed for anxiety.  Marland Kitchen esomeprazole (NEXIUM) 40 MG capsule Take 1 capsule (40 mg total) by mouth daily.  Marland Kitchen levonorgestrel (MIRENA) 20 MCG/24HR IUD 1 Intra Uterine Device (1 each total) by Intrauterine route once for 1 dose.  . levothyroxine (SYNTHROID) 50 MCG tablet Take 1 tablet (50 mcg total) by mouth daily.  . traMADol (ULTRAM) 50 MG tablet Take 1 tablet (50 mg total) by mouth every 8 (eight) hours as needed for up to 5 days.  Marland Kitchen venlafaxine (EFFEXOR) 100 MG tablet Take 1 tablet (100 mg total) by mouth 3 (three) times daily.  . vitamin B-12 (CYANOCOBALAMIN) 1000 MCG tablet Take 1 tablet (1,000 mcg total) by mouth daily.    Allergies: Patient has No Known Allergies. Family History: Patient family history is not on file. Social History:  Patient  reports that she has quit smoking. She has never used smokeless tobacco. She reports that she does not drink alcohol and does not use drugs.  Review of Systems: Constitutional: Negative for fever malaise or anorexia Cardiovascular: negative for chest pain Respiratory: negative for SOB or persistent cough Gastrointestinal: negative for abdominal pain  Objective  Vitals: BP 124/82   Pulse 74   Temp (!) 97.3 F (36.3 C) (Temporal)   Ht 5\' 3"  (1.6 m)   Wt 193 lb 3.2 oz (87.6 kg)   SpO2 97%   BMI 34.22 kg/m  General: no acute distress , A&Ox3    Commons side effects, risks, benefits, and alternatives for medications and treatment plan prescribed today were discussed, and the patient expressed understanding of the given instructions. Patient is instructed to call or message via MyChart if he/she has any questions or concerns regarding our treatment plan. No barriers to understanding were identified. We discussed Red Flag symptoms and signs in detail. Patient expressed understanding regarding what to do in case of urgent or emergency type symptoms.   Medication list was reconciled, printed and provided to the patient in AVS.  Patient instructions and summary information was reviewed with the patient as documented in the AVS. This note was prepared with assistance of Dragon voice recognition software. Occasional wrong-word or sound-a-like substitutions may have occurred due to the inherent limitations of voice recognition software  This visit occurred during the SARS-CoV-2 public health emergency.  Safety protocols were in place, including screening questions prior to the visit, additional usage of staff PPE, and extensive cleaning of exam room while observing appropriate contact time as indicated for disinfecting solutions.

## 2020-04-22 NOTE — Patient Instructions (Signed)
Please return in 4 weeks for recheck.   We will call you to schedule your appointment with the gastroenterologist.   I have ordered tramadol for your headaches to use intermittently.  Stress reduction will help you headaches.   If you have any questions or concerns, please don't hesitate to send me a message via MyChart or call the office at [phone]. Thank you for visiting with Lindsay Dennis today! It's our pleasure caring for you.   Tension Headache, Adult A tension headache is a feeling of pain, pressure, or aching in the head that is often felt over the front and sides of the head. The pain can be dull, or it can feel tight (constricting). There are two types of tension headache:  Episodic tension headache. This is when the headaches happen fewer than 15 days a month.  Chronic tension headache. This is when the headaches happen more than 15 days a month during a 77-month period. A tension headache can last from 30 minutes to several days. It is the most common kind of headache. Tension headaches are not normally associated with nausea or vomiting, and they do not get worse with physical activity. What are the causes? The exact cause of this condition is not known. Tension headaches are often triggered by stress, anxiety, or depression. Other triggers include:  Alcohol.  Too much caffeine or caffeine withdrawal.  Respiratory infections, such as colds, flu, or sinus infections.  Dental problems or teeth clenching.  Tiredness (fatigue).  Holding your head and neck in the same position for a long period of time, such as while using a computer.  Smoking.  Arthritis of the neck. What are the signs or symptoms? Symptoms of this condition include:  A feeling of pressure or tightness around the head.  Dull, aching head pain.  Pain over the front and sides of the head.  Tenderness in the muscles of the head, neck, and shoulders. How is this diagnosed? This condition may be diagnosed  based on your symptoms, your medical history, and a physical exam. If your symptoms are severe or unusual, you may have imaging tests, such as a CT scan or an MRI of your head. Your vision may also be checked. How is this treated? This condition may be treated with lifestyle changes and with medicines that help relieve symptoms. Follow these instructions at home: Managing pain  Take over-the-counter and prescription medicines only as told by your health care provider.  When you have a headache, lie down in a dark, quiet room.  If directed, apply ice to the head and neck: ? Put ice in a plastic bag. ? Place a towel between your skin and the bag. ? Leave the ice on for 20 minutes, 2-3 times a day.  If directed, apply heat to the back of your neck as often as told by your health care provider. Use the heat source that your health care provider recommends, such as a moist heat pack or a heating pad. ? Place a towel between your skin and the heat source. ? Leave the heat on for 20-30 minutes. ? Remove the heat if your skin turns bright red. This is especially important if you are unable to feel pain, heat, or cold. You may have a greater risk of getting burned. Eating and drinking  Eat meals on a regular schedule.  Limit alcohol intake to no more than 1 drink a day for nonpregnant women and 2 drinks a day for men. One drink equals 12  oz of beer, 5 oz of wine, or 1 oz of hard liquor.  Drink enough fluid to keep your urine pale yellow.  Decrease your caffeine intake, or stop using caffeine. Lifestyle  Get 7-9 hours of sleep each night, or get the amount of sleep recommended by your health care provider.  At bedtime, remove all electronic devices from your room. Electronic devices include computers, phones, and tablets.  Find ways to manage your stress. Some things that can help relieve stress include: ? Exercise. ? Deep breathing exercises. ? Yoga. ? Listening to music. ? Positive  mental imagery.  Try to sit up straight and avoid tensing your muscles.  Do not use any products that contain nicotine or tobacco, such as cigarettes and e-cigarettes. If you need help quitting, ask your health care provider. General instructions   Keep all follow-up visits as told by your health care provider. This is important.  Avoid any headache triggers. Keep a headache journal to help find out what may trigger your headaches. For example, write down: ? What you eat and drink. ? How much sleep you get. ? Any change to your diet or medicines. Contact a health care provider if:  Your headache does not get better.  Your headache comes back.  You are sensitive to sounds, light, or smells because of a headache.  You have nausea or you vomit.  Your stomach hurts. Get help right away if:  You suddenly develop a very severe headache along with any of the following: ? A stiff neck. ? Nausea and vomiting. ? Confusion. ? Weakness. ? Double vision or loss of vision. ? Shortness of breath. ? Rash. ? Unusual sleepiness. ? Fever. ? Trouble speaking. ? Pain in your eyes or ears. ? Trouble walking or balancing. ? Feeling faint or passing out. Summary  A tension headache is a feeling of pain, pressure, or aching in the head that is often felt over the front and sides of the head.  A tension headache can last from 30 minutes to several days. It is the most common kind of headache.  This condition may be diagnosed based on your symptoms, your medical history, and a physical exam.  This condition may be treated with lifestyle changes and with medicines that help relieve symptoms. This information is not intended to replace advice given to you by your health care provider. Make sure you discuss any questions you have with your health care provider. Document Revised: 02/08/2019 Document Reviewed: 07/24/2016 Elsevier Patient Education  2020 [employer].

## 2020-05-03 ENCOUNTER — Emergency Department (HOSPITAL_COMMUNITY)
Admission: EM | Admit: 2020-05-03 | Discharge: 2020-05-04 | Disposition: A | Discharge status: Home / Self Care | Payer: BC Managed Care – PPO | Attending: Emergency Medicine | Admitting: Emergency Medicine

## 2020-05-03 DIAGNOSIS — I10 Essential (primary) hypertension: Secondary | ICD-10-CM | POA: Insufficient documentation

## 2020-05-03 DIAGNOSIS — R1010 Upper abdominal pain, unspecified: Secondary | ICD-10-CM | POA: Insufficient documentation

## 2020-05-03 DIAGNOSIS — R10812 Left upper quadrant abdominal tenderness: Secondary | ICD-10-CM | POA: Insufficient documentation

## 2020-05-03 DIAGNOSIS — E039 Hypothyroidism, unspecified: Secondary | ICD-10-CM | POA: Insufficient documentation

## 2020-05-03 DIAGNOSIS — Z87891 Personal history of nicotine dependence: Secondary | ICD-10-CM | POA: Insufficient documentation

## 2020-05-03 DIAGNOSIS — Z79899 Other long term (current) drug therapy: Secondary | ICD-10-CM | POA: Insufficient documentation

## 2020-05-03 DIAGNOSIS — R112 Nausea with vomiting, unspecified: Secondary | ICD-10-CM | POA: Diagnosis not present

## 2020-05-03 DIAGNOSIS — R1013 Epigastric pain: Secondary | ICD-10-CM | POA: Diagnosis present

## 2020-05-03 LAB — CBC
HCT: 45.1 % (ref 36.0–46.0)
Hemoglobin: 14.8 g/dL (ref 12.0–15.0)
MCH: 28.2 pg (ref 26.0–34.0)
MCHC: 32.8 g/dL (ref 30.0–36.0)
MCV: 85.9 fL (ref 80.0–100.0)
Platelets: 275 10*3/uL (ref 150–400)
RBC: 5.25 MIL/uL — ABNORMAL HIGH (ref 3.87–5.11)
RDW: 13.2 % (ref 11.5–15.5)
WBC: 7.5 10*3/uL (ref 4.0–10.5)
nRBC: 0 % (ref 0.0–0.2)

## 2020-05-03 LAB — URINALYSIS, ROUTINE W REFLEX MICROSCOPIC
Bilirubin Urine: NEGATIVE
Glucose, UA: NEGATIVE mg/dL
Hgb urine dipstick: NEGATIVE
Ketones, ur: 5 mg/dL — AB
Nitrite: NEGATIVE
Protein, ur: NEGATIVE mg/dL
Specific Gravity, Urine: 1.026 (ref 1.005–1.030)
pH: 5 (ref 5.0–8.0)

## 2020-05-03 LAB — I-STAT BETA HCG BLOOD, ED (MC, WL, AP ONLY): I-stat hCG, quantitative: <5 m[IU]/mL (ref <5)

## 2020-05-03 LAB — COMPREHENSIVE METABOLIC PANEL
ALT: 18 U/L (ref 0–44)
AST: 17 U/L (ref 15–41)
Albumin: 3.9 g/dL (ref 3.5–5.0)
Alkaline Phosphatase: 39 U/L (ref 38–126)
Anion gap: 9 (ref 5–15)
BUN: 10 mg/dL (ref 6–20)
CO2: 22 mmol/L (ref 22–32)
Calcium: 9.3 mg/dL (ref 8.9–10.3)
Chloride: 107 mmol/L (ref 98–111)
Creatinine, Ser: 0.86 mg/dL (ref 0.44–1.00)
GFR, Estimated: >60 mL/min (ref >60)
Glucose, Bld: 93 mg/dL (ref 70–99)
Potassium: 4.5 mmol/L (ref 3.5–5.1)
Sodium: 138 mmol/L (ref 135–145)
Total Bilirubin: 0.6 mg/dL (ref 0.3–1.2)
Total Protein: 7.1 g/dL (ref 6.5–8.1)

## 2020-05-03 LAB — LIPASE, BLOOD: Lipase: 28 U/L (ref 11–51)

## 2020-05-03 NOTE — ED Triage Notes (Signed)
Pt here with reports of hx hernia repair and gastric sleeve. Pt moved here a year ago from [place] and the medication she was on is not one that she can get here in the [place]. Pt started on hyoscyamine. Pt states after taking the hyoscyamine she developed severe abd pain. Pt with hx gastritis. Reports over the last 2 days RUQ abdominal pain. All information was obtained via interpreter [phone].

## 2020-05-04 ENCOUNTER — Other Ambulatory Visit: Payer: Self-pay

## 2020-05-04 ENCOUNTER — Emergency Department (HOSPITAL_COMMUNITY): Payer: BC Managed Care – PPO

## 2020-05-04 MED ORDER — ESOMEPRAZOLE MAGNESIUM 40 MG PO CPDR: 40.0000 mg | DELAYED_RELEASE_CAPSULE | Freq: Two times a day (BID) | ORAL | 3 refills | Status: DC

## 2020-05-04 MED ORDER — IOHEXOL 300 MG/ML  SOLN
100.0000 mL | Freq: Once | INTRAMUSCULAR | Status: AC | PRN
  Administered 2020-05-04: 100 mL via INTRAVENOUS

## 2020-05-04 MED ORDER — MORPHINE SULFATE (PF) 4 MG/ML IV SOLN
4.0000 mg | Freq: Once | INTRAVENOUS | Status: AC
Start: 2020-05-04 — End: 2020-05-04
  Administered 2020-05-04: 4 mg via INTRAVENOUS
  Filled 2020-05-04: qty 1

## 2020-05-04 MED ORDER — ACETAMINOPHEN 325 MG PO TABS
650.0000 mg | ORAL_TABLET | Freq: Once | ORAL | Status: DC
  Filled 2020-05-04: qty 2

## 2020-05-04 MED ORDER — BISMUTH SUBSALICYLATE 525 MG/15ML PO SUSP: 15.0000 mL | ORAL | 0 refills | Status: DC | PRN

## 2020-05-04 MED ORDER — ONDANSETRON HCL 4 MG/2ML IJ SOLN
4.0000 mg | Freq: Once | INTRAMUSCULAR | Status: AC
  Administered 2020-05-04: 4 mg via INTRAVENOUS
  Filled 2020-05-04: qty 2

## 2020-05-04 NOTE — ED Notes (Addendum)
Lengthy d/c with [place] interpreter. Pt extremely adamant that she needs a morphine prescription for home pain control. Also requesting something for a headache today. EDP gave a one time order of tylenol, pt refused, stated, "I usually take PR valium for my headaches or something stronger."

## 2020-05-04 NOTE — ED Notes (Signed)
Patient transported to CT 

## 2020-05-04 NOTE — Discharge Instructions (Addendum)
Increase esomeprazole to twice a day.  Take bismuth subsalicylate (Pepto Bismol) as needed for abdominal pain.  Keep your appointment with the gastroenterologist.  As far as I know, the medicine your gastroenterologist gave you when you were in [place] is not sold here.  Return if symptoms are getting worse.

## 2020-05-04 NOTE — ED Notes (Signed)
[place] interpreter [place] [phone] used for dc instructions.

## 2020-05-04 NOTE — ED Notes (Signed)
Pharmacy technician speaking with pt. RN will discuss pt discharge paperwork when pharmacy tech is finished and the translator is available.

## 2020-05-04 NOTE — ED Provider Notes (Signed)
[facility] EMERGENCY DEPARTMENT Provider Note   CSN: [id number] Arrival date & time: 05/03/20  1028   History Chief complaint: Abdominal pain  Lindsay Dennis is a 50 y.o. female.  The history is provided by the patient. A language interpreter was used.  She has history of hypertension, depression and is status post gastric sleeve surgery for obesity and comes in because of epigastric pain for the last 2 days.  There is no associated nausea or vomiting.  She denies fever or chills.  Nothing makes pain better, nothing makes it worse.  Past Medical History:  Diagnosis Date  . Acquired hypothyroidism 10/09/2019  . Essential hypertension 10/09/2019  . Major depression, recurrent, chronic (HCC) 10/09/2019  . Migraine headache without aura 10/09/2019    Patient Active Problem List   Diagnosis Date Noted  . Vitamin B12 deficiency 11/09/2019  . Major depression, recurrent, chronic (HCC) 10/09/2019  . Migraine headache without aura 10/09/2019  . Essential hypertension 10/09/2019  . Acquired hypothyroidism 10/09/2019  . Gastric bypass status for obesity 10/09/2019  . IUD (intrauterine device) in place 10/09/2019    Past Surgical History:  Procedure Laterality Date  . CHOLECYSTECTOMY  2004  . SLEEVE GASTROPLASTY  2018     OB History   No obstetric history on file.     No family history on file.  Social History   Tobacco Use  . Smoking status: Former Games developer  . Smokeless tobacco: Never Used  Vaping Use  . Vaping Use: Never used  Substance Use Topics  . Alcohol use: Never  . Drug use: Never    Home Medications Prior to Admission medications   Medication Sig Start Date End Date Taking? Authorizing Provider  amLODipine (NORVASC) 10 MG tablet Take 1 tablet (10 mg total) by mouth daily. 10/09/19   Willow Ora, MD  clorazepate (TRANXENE) 15 MG tablet Take 1 tablet (15 mg total) by mouth 3 (three) times daily as needed for anxiety. 11/29/19   Willow Ora, MD  esomeprazole (NEXIUM) 40 MG capsule Take 1 capsule (40 mg total) by mouth daily. 10/09/19   Willow Ora, MD  Hyoscyamine Sulfate SL (LEVSIN/SL) 0.125 MG SUBL Place 0.125 mg under the tongue 2 (two) times daily as needed. As needed for bloating and cramping Patient not taking: Reported on 04/22/2020 04/08/20   Willow Ora, MD  levonorgestrel (MIRENA) 20 MCG/24HR IUD 1 Intra Uterine Device (1 each total) by Intrauterine route once for 1 dose. 10/09/19   Willow Ora, MD  levothyroxine (SYNTHROID) 50 MCG tablet Take 1 tablet (50 mcg total) by mouth daily. 10/09/19   Willow Ora, MD  venlafaxine (EFFEXOR) 100 MG tablet Take 1 tablet (100 mg total) by mouth 3 (three) times daily. 11/02/19   Willow Ora, MD  vitamin B-12 (CYANOCOBALAMIN) 1000 MCG tablet Take 1 tablet (1,000 mcg total) by mouth daily. 11/08/19   Willow Ora, MD    Allergies    Patient has no known allergies.  Review of Systems   Review of Systems  All other systems reviewed and are negative.   Physical Exam Updated Vital Signs BP 128/81   Pulse 77   Temp 98.1 F (36.7 C)   Resp (!) 24   SpO2 97%   Physical Exam Vitals and nursing note reviewed.   50 year old female, resting comfortably and in no acute distress. Vital signs are significant for mildly elevated respiratory rate. Oxygen saturation is 97%, which is normal.  Head is normocephalic and atraumatic. PERRLA, EOMI. Oropharynx is clear. Neck is nontender and supple without adenopathy or JVD. Back is nontender and there is no CVA tenderness. Lungs are clear without rales, wheezes, or rhonchi. Chest is nontender. Heart has regular rate and rhythm without murmur. Abdomen is soft, flat, with moderate left upper quadrant tenderness.  There is no rebound or guarding.  There are no masses or hepatosplenomegaly and peristalsis is hypoactive. Extremities have no cyanosis or edema, full range of motion is present. Skin is warm and dry without  rash. Neurologic: Mental status is normal, cranial nerves are intact, there are no motor or sensory deficits.  ED Results / Procedures / Treatments   Labs (all labs ordered are listed, but only abnormal results are displayed) Labs Reviewed  CBC - Abnormal; Notable for the following components:      Result Value   RBC 5.25 (*)    All other components within normal limits  URINALYSIS, ROUTINE W REFLEX MICROSCOPIC - Abnormal; Notable for the following components:   Color, Urine AMBER (*)    APPearance HAZY (*)    Ketones, ur 5 (*)    Leukocytes,Ua SMALL (*)    Bacteria, UA RARE (*)    All other components within normal limits  LIPASE, BLOOD  COMPREHENSIVE METABOLIC PANEL  I-STAT BETA HCG BLOOD, ED ([facility], [facility], [facility] ONLY)   Radiology CT ABDOMEN PELVIS W CONTRAST  Result Date: 05/04/2020 CLINICAL DATA:  Initial evaluation for acute upper abdominal pain. History of prior gastric sleeve surgery. EXAM: CT ABDOMEN AND PELVIS WITH CONTRAST TECHNIQUE: Multidetector CT imaging of the abdomen and pelvis was performed using the standard protocol following bolus administration of intravenous contrast. CONTRAST:  OMNIPAQUE IOHEXOL 300 MG/ML  SOLN COMPARISON:  None available. FINDINGS: Lower chest: Minimal subsegmental atelectasis seen dependently within the visualized lung bases. Visualized lungs are otherwise clear. Hepatobiliary: Liver demonstrates a normal contrast enhanced appearance. Gallbladder surgically absent. Mild intra and extrahepatic biliary dilatation likely reflects post cholecystectomy changes. Pancreas: Pancreas within normal limits. Spleen: Spleen within normal limits. Adrenals/Urinary Tract: Adrenal glands are normal. Kidneys equal size with symmetric enhancement. No nephrolithiasis, hydronephrosis, or focal enhancing renal mass. No visible hydroureter. Partially distended bladder within normal limits. Stomach/Bowel: Postoperative changes present about the stomach. No adverse features. No  evidence for bowel obstruction. Appendix within normal limits. No acute inflammatory changes seen about the bowels. Vascular/Lymphatic: Normal intravascular enhancement seen throughout the intra-abdominal aorta. Mesenteric vessels patent proximally. No adenopathy. Reproductive: IUD in appropriate position within the uterus. 1.4 cm simple left ovarian cyst noted, likely a functional cyst. Ovaries otherwise unremarkable. Other: No free air or fluid. Musculoskeletal: No acute osseous abnormality. No discrete or worrisome osseous lesions. IMPRESSION: 1. No CT evidence for acute intra-abdominal or pelvic process. 2. Postoperative changes about the stomach without adverse features. 3. Status post cholecystectomy. Electronically Signed   By: Rise Mu M.D.   On: 05/04/2020 04:32    Procedures Procedures   Medications Ordered in ED Medications  morphine 4 MG/ML injection 4 mg (4 mg Intravenous Given 05/04/20 0332)  ondansetron (ZOFRAN) injection 4 mg (4 mg Intravenous Given 05/04/20 0333)  iohexol (OMNIPAQUE) 300 MG/ML solution 100 mL (100 mLs Intravenous Contrast Given 05/04/20 0403)    ED Course  I have reviewed the triage vital signs and the nursing notes.  Pertinent labs & imaging results that were available during my care of the patient were reviewed by me and considered in my medical decision making (see chart  for details).  MDM Rules/Calculators/A&P Upper abdominal pain in patient with history of gastric sleeve surgery.  Concern for possible internal hernia.  Labs are unremarkable.  Will send for CT scan.  CT scan shows no acute process.  Patient is advised of this finding.  She already has an appointment with gastroenterology on January 25.  She is advised to keep that appointment.  In the meantime, I have advised her to increase her esomeprazole from once a day to twice a day and use antacids as needed.  Return if symptoms are worsening.  Final Clinical Impression(s) / ED  Diagnoses Final diagnoses:  Upper abdominal pain    Rx / DC Orders ED Discharge Orders         Ordered    esomeprazole (NEXIUM) 40 MG capsule  2 times daily before meals        05/04/20 0638    Bismuth Subsalicylate 525 MG/15ML SUSP  Every 4 hours PRN        05/04/20 2542           Dione Booze, MD 05/04/20 [phone]

## 2020-05-21 ENCOUNTER — Other Ambulatory Visit: Payer: Self-pay

## 2020-05-21 ENCOUNTER — Ambulatory Visit: Payer: BC Managed Care – PPO | Admitting: Gastroenterology

## 2020-05-21 ENCOUNTER — Encounter: Payer: Self-pay | Admitting: Gastroenterology

## 2020-05-21 VITALS — BP 110/80 | HR 60 | Ht 63.0 in | Wt 189.2 lb

## 2020-05-21 DIAGNOSIS — R1013 Epigastric pain: Secondary | ICD-10-CM | POA: Diagnosis not present

## 2020-05-21 DIAGNOSIS — Z903 Acquired absence of stomach [part of]: Secondary | ICD-10-CM | POA: Diagnosis not present

## 2020-05-21 DIAGNOSIS — Z1211 Encounter for screening for malignant neoplasm of colon: Secondary | ICD-10-CM | POA: Diagnosis not present

## 2020-05-21 DIAGNOSIS — K920 Hematemesis: Secondary | ICD-10-CM | POA: Diagnosis not present

## 2020-05-21 NOTE — Patient Instructions (Addendum)
If you are age 49 or older, your body mass index should be between 23-30. Your Body mass index is 33.52 kg/m. If this is out of the aforementioned range listed, please consider follow up with your Primary Care Provider.  If you are age 58 or younger, your body mass index should be between 19-25. Your Body mass index is 33.52 kg/m. If this is out of the aformentioned range listed, please consider follow up with your Primary Care Provider.   You have been scheduled for an endoscopy. Please follow the written instructions given to you at your visit today. Please pick up your prep supplies at the pharmacy within the next 1-3 days. If you use inhalers (even only as needed), please bring them with you on the day of your procedure.  Please do not take Carafate the evening before or the morning of the procedure.  Otherwise you can continue Nexium and Carafate.    Thank you for entrusting me with your care and for choosing [facility], Dr. Ileene Patrick

## 2020-05-21 NOTE — Progress Notes (Signed)
HPI :  50 year old female, [place] speaking, accompanied by her husband who is providing translation, history of obesity status post gastric sleeve, hypothyroidism, headaches, referred here by Dr. Billey Dennis for abdominal pain.  She reports having had gastric sleeve done in [place] about 4 years ago.  They also mention she had some sort of hernia at that time, unclear if his hiatal hernia or abdominal wall hernia.  She states she was having some discomfort in her abdomen about a year ago and had some testing done in [place] which included an EGD and told she had gastritis.  She was given a medication called "duspaltine" - no available in [place], which helped somewhat but symptoms have not resolved.  Essentially she has been having persistent epigastric discomfort for several months now since she has moved to the [place].  Initially this was a constant discomfort but now it is more intermittent.  If she eats too much this will definitely make her symptoms worse although does not have to be eating to have the symptoms bother her.  She has had some occasional nausea vomiting with this, states she had a little bit of blood in her vomit when this has occurred.  She tries to eat smaller meals when her stomach bothers her.  She denies much heartburn or reflux.  No dysphagia.  She has not seen any obvious blood in her stool.  She does not have any diarrhea but has occasional constipation.  No family history of gastric cancer or colon cancer.  Sounds like she was given a trial of Levsin for this but she states it made her headaches worse so she stopped it.  She had an acute worsening of symptoms that was quite severe in early January, she went to the ED and had a CT scan which showed no acute abnormalities on January 8.  She also had a normal CBC, c-Met, and lipase at that time.  She was given a trial of Nexium 40 mg twice daily and Carafate.  She states that had provided some benefit to her but did not resolve it.   Generally she is feeling a bit better lately with time with this.  From what I can gather they generally avoid NSAIDs and she only uses Tylenol as needed for headaches.  She had Covid about 3-4 weeks ago and is fully vaccinated.  She is made a full recovery from her Covid illness.  She had her gallbladder removed in 2004 in [place].  She has never had a prior colonoscopy.  We discussed options as below  Prior workup: Normal CBC, CMET, lipase  CT abdomen pelvis 05/04/20 - IMPRESSION: 1. No CT evidence for acute intra-abdominal or pelvic process. 2. Postoperative changes about the stomach without adverse features. 3. Status post cholecystectomy.   Past Medical History:  Diagnosis Date  . Acquired hypothyroidism 10/09/2019  . Essential hypertension 10/09/2019  . Major depression, recurrent, chronic ([place]) 10/09/2019  . Migraine headache without aura 10/09/2019     Past Surgical History:  Procedure Laterality Date  . CHOLECYSTECTOMY  2004  . SLEEVE GASTROPLASTY  2018   Family History  Problem Relation Age of Onset  . Lung cancer Maternal Grandmother   . Colon polyps Neg Hx   . Colon cancer Neg Hx    Social History   Tobacco Use  . Smoking status: Former Research scientist (life sciences)  . Smokeless tobacco: Never Used  Vaping Use  . Vaping Use: Never used  Substance Use Topics  . Alcohol use: Never  .  Drug use: Never   Current Outpatient Medications  Medication Sig Dispense Refill  . amLODipine (NORVASC) 10 MG tablet Take 1 tablet (10 mg total) by mouth daily. 90 tablet 3  . Bismuth Subsalicylate 166 AY/30ZS SUSP Take 15 mLs (525 mg total) by mouth every 4 (four) hours as needed (abdominal pain). 480 mL 0  . CAPTOPRIL PO Place 25 mg under the tongue See admin instructions. As needed for low blood pressure    . clorazepate (TRANXENE) 15 MG tablet Take 1 tablet (15 mg total) by mouth 3 (three) times daily as needed for anxiety. 90 tablet 5  . esomeprazole (NEXIUM) 40 MG capsule Take 1 capsule (40 mg  total) by mouth 2 (two) times daily before a meal. 60 capsule 3  . levonorgestrel (MIRENA) 20 MCG/24HR IUD 1 Intra Uterine Device (1 each total) by Intrauterine route once for 1 dose. 1 each 0  . levothyroxine (SYNTHROID) 50 MCG tablet Take 1 tablet (50 mcg total) by mouth daily. 90 tablet 3  . OVER THE COUNTER MEDICATION Take 1 tablet by mouth daily. Antidol (pt gets from [place])    . ramipril (ALTACE) 5 MG capsule Take 5 mg by mouth daily.    Marland Kitchen venlafaxine (EFFEXOR) 100 MG tablet Take 1 tablet (100 mg total) by mouth 3 (three) times daily. 270 tablet 3  . vitamin B-12 (CYANOCOBALAMIN) 1000 MCG tablet Take 1 tablet (1,000 mcg total) by mouth daily.     No current facility-administered medications for this visit.   Allergies  Allergen Reactions  . Levsin [Hyoscyamine]      Review of Systems: All systems reviewed and negative except where noted in HPI.    CT ABDOMEN PELVIS W CONTRAST  Result Date: 05/04/2020 CLINICAL DATA:  Initial evaluation for acute upper abdominal pain. History of prior gastric sleeve surgery. EXAM: CT ABDOMEN AND PELVIS WITH CONTRAST TECHNIQUE: Multidetector CT imaging of the abdomen and pelvis was performed using the standard protocol following bolus administration of intravenous contrast. CONTRAST:  148m OMNIPAQUE IOHEXOL 300 MG/ML  SOLN COMPARISON:  None available. FINDINGS: Lower chest: Minimal subsegmental atelectasis seen dependently within the visualized lung bases. Visualized lungs are otherwise clear. Hepatobiliary: Liver demonstrates a normal contrast enhanced appearance. Gallbladder surgically absent. Mild intra and extrahepatic biliary dilatation likely reflects post cholecystectomy changes. Pancreas: Pancreas within normal limits. Spleen: Spleen within normal limits. Adrenals/Urinary Tract: Adrenal glands are normal. Kidneys equal size with symmetric enhancement. No nephrolithiasis, hydronephrosis, or focal enhancing renal mass. No visible hydroureter.  Partially distended bladder within normal limits. Stomach/Bowel: Postoperative changes present about the stomach. No adverse features. No evidence for bowel obstruction. Appendix within normal limits. No acute inflammatory changes seen about the bowels. Vascular/Lymphatic: Normal intravascular enhancement seen throughout the intra-abdominal aorta. Mesenteric vessels patent proximally. No adenopathy. Reproductive: IUD in appropriate position within the uterus. 1.4 cm simple left ovarian cyst noted, likely a functional cyst. Ovaries otherwise unremarkable. Other: No free air or fluid. Musculoskeletal: No acute osseous abnormality. No discrete or worrisome osseous lesions. IMPRESSION: 1. No CT evidence for acute intra-abdominal or pelvic process. 2. Postoperative changes about the stomach without adverse features. 3. Status post cholecystectomy. Electronically Signed   By: BJeannine BogaM.D.   On: 05/04/2020 04:32   . Lab Results  Component Value Date   WBC 7.5 05/03/2020   HGB 14.8 05/03/2020   HCT 45.1 05/03/2020   MCV 85.9 05/03/2020   PLT 275 05/03/2020    Lab Results  Component Value Date   CREATININE  0.86 05/03/2020   BUN 10 05/03/2020   NA 138 05/03/2020   K 4.5 05/03/2020   CL 107 05/03/2020   CO2 22 05/03/2020    Lab Results  Component Value Date   ALT 18 05/03/2020   AST 17 05/03/2020   ALKPHOS 39 05/03/2020   BILITOT 0.6 05/03/2020     Physical Exam: BP 110/80   Pulse 60   Ht 5' 3"  (1.6 m)   Wt 189 lb 3.2 oz (85.8 kg)   BMI 33.52 kg/m  Constitutional: Pleasant,well-developed, female in no acute distress. HEENT: Normocephalic and atraumatic. Conjunctivae are normal. No scleral icterus. Neck supple.  Cardiovascular: Normal rate, regular rhythm.  Pulmonary/chest: Effort normal and breath sounds normal.  Abdominal: Soft, nondistended, nontender. . There are no masses palpable.  Extremities: no edema Lymphadenopathy: No cervical adenopathy noted. Neurological:  Alert and oriented to person place and time. Skin: Skin is warm and dry. No rashes noted. Psychiatric: Normal mood and affect. Behavior is normal.   ASSESSMENT AND PLAN: 50 year old female here for new patient evaluation regarding the following:  Epigastric pain / Hematemesis  / History of gastric sleeve - as above, history of gastric sleeve years ago with some intermittent abdominal pains, now progressively worsening over the past year with severe flare of epigastric pain leading to ED visit recently.  Labs and CT scan are reassuring. She is s/p cholecystectomy. She has gotten somewhat better with PPI and Carafate so far.  Discussed differential diagnosis with her.  Given her vomiting and hematemesis previously, and recent course with worsening pain, I am recommending an upper endoscopy to clear her upper GI tract, rule out PUD, H. pylori gastritis, complications from surgical history, etc. I discussed risk benefits of EGD and anesthesia with her and her husband and they wanted to proceed.  Given Nexium and Carafate has provided benefit over the past week I recommend continuing that for now until we perform her endoscopy.  She should continue to avoid NSAIDs.  Further recommendations pending results of her endoscopy and course.  Colon cancer screening - no prior screening, no alarm symptoms otherwise.  I offered her colonoscopy to be done at the same time as her upper endoscopy.  She understands the importance of colon cancer screening and does wish to do that at some point time but not clear she can tolerate a bowel prep right now wants to focus on her primary symptom.  She can call to schedule colonoscopy at her convenience.  Bennington Cellar, MD [facility]  CC: Leamon Arnt, MD

## 2020-05-23 ENCOUNTER — Encounter: Payer: BC Managed Care – PPO | Admitting: Gastroenterology

## 2020-05-23 ENCOUNTER — Telehealth: Payer: Self-pay | Admitting: *Deleted

## 2020-05-23 NOTE — Telephone Encounter (Signed)
Thanks Dorene Grebe, they can reschedule once she recovers. I think she recently had COVID and recovered so that may be unlikely, will await her course. Thanks

## 2020-05-23 NOTE — Telephone Encounter (Signed)
Pt no show for appointment.  Phoned pts husband and he states that his wife started feeling bad last night and was running a fever.  He forgot to call to let us know.  He apologized and states that he will call back to reschedule because it is very important to them to get this procedure done.

## 2020-05-24 ENCOUNTER — Ambulatory Visit: Payer: BC Managed Care – PPO | Admitting: Family Medicine

## 2020-07-05 LAB — HM MAMMOGRAPHY

## 2020-07-06 ENCOUNTER — Other Ambulatory Visit: Payer: Self-pay | Admitting: Family Medicine

## 2020-07-08 NOTE — Telephone Encounter (Signed)
Last refill:  Clorazepate 11/29/19 #90, 5 - 6 months script expired Tramadol: 04/22/20 #15, 0  Last OV: 04/22/20 dx. Chronic gastritis w/o bleeding

## 2020-11-30 ENCOUNTER — Other Ambulatory Visit: Payer: Self-pay | Admitting: Family Medicine

## 2020-12-02 ENCOUNTER — Telehealth: Payer: Self-pay

## 2020-12-02 NOTE — Telephone Encounter (Signed)
Pt requesting refill on Tramadol. Last OV 03/2020.

## 2020-12-02 NOTE — Telephone Encounter (Signed)
LAST APPOINTMENT DATE: 04/22/20  NEXT APPOINTMENT DATE: None  MEDICATION:traMADol (ULTRAM) 50 MG tablet  PHARMACY:[id number] - [place], [place] - [address] [place]

## 2020-12-02 NOTE — Telephone Encounter (Signed)
Last refill: 07/08/20 #20, 0 Last OV: 04/22/20

## 2020-12-02 NOTE — Telephone Encounter (Signed)
Nurse Assessment Nurse: Carlos Levering, RN, Tobi Bastos Date/Time Lamount Cohen Time): 12/01/2020 5:49:42 PM Confirm and document reason for call. If symptomatic, describe symptoms. ---Caller states wife is out of tramadol for migraines. Currently has a migraine. She has tried tylenol as well with no relief. Denies vision changes or vomiting. Does the patient have any new or worsening symptoms? ---Yes Will a triage be completed? ---Yes Related visit to physician within the last 2 weeks? ---No Does the PT have any chronic conditions? (i.e. diabetes, asthma, this includes High risk factors for pregnancy, etc.) ---No Is the patient pregnant or possibly pregnant? (Ask all females between the ages of 45-55) ---No Is this a behavioral health or substance abuse call? ---No Nurse: Carlos Levering, RN, Tobi Bastos Date/Time (Eastern Time): 12/01/2020 6:05:51 PM Please select the assessment type ---Refill Additional Documentation ---caller states tramadol refill was supposed to be sent in but is not at the pharmacy Does the patient have enough medication to last until the office opens? ---Unable to obtain loaner dose from Pharmacy PLEASE NOTE: All timestamps contained within this report are represented as [place] Standard Time. CONFIDENTIALTY NOTICE: This fax transmission is intended only for the addressee. It contains information that is legally privileged, confidential or otherwise protected from use or disclosure. If you are not the intended recipient, you are strictly prohibited from reviewing, disclosing, copying using or disseminating any of this information or taking any action in reliance on or regarding this information. If you have received this fax in error, please notify us immediately by telephone so that we can arrange for its return to [place]. Phone: [phone], Toll-Free: [phone], Fax: [phone] Page: 2 of 2 Call Id: [id number] Nurse Assessment Does the client directives allow for assistance with medications  after hours? ---No Guidelines Guideline Title Affirmed Question Affirmed Notes Nurse Date/Time (Eastern Time) Headache Headache is a chronic symptom (recurrent or ongoing AND present > 4 weeks) Carlos Levering, RN, Tobi Bastos 12/01/2020 5:51:21 PM Disp. Time Lamount Cohen Time) Disposition Final User 12/01/2020 6:05:15 PM See PCP within 2 Weeks Yes Quandt, RN, Carrolyn Leigh Disagree/Comply Comply Caller Understands Yes PreDisposition Go to Urgent Care/Walk-In Clinic Care Advice Given Per Guideline SEE PCP WITHIN 2 WEEKS: * You need to be seen for this ongoing problem within the next 2 weeks. CARE ADVICE given per Headache (Adult) guideline. CALL BACK IF: * Severe headache persists over 2 hours after pain medicine * Headache lasts over 24 hours despite using a pain medicine * You become worse Comments User: Karn Pickler, RN Date/Time Lamount Cohen Time): 12/01/2020 5:54:04 PM HA 9/10 User: Karn Pickler, RN Date/Time Lamount Cohen Time): 12/01/2020 5:58:12 PM reports went to UC a few hours ago for HA, they offered meds but caller thought they had something ready for pickup at pharmacy Referrals [facility] - [facility]

## 2021-01-08 ENCOUNTER — Other Ambulatory Visit: Payer: Self-pay | Admitting: Family Medicine

## 2021-02-04 LAB — RESULTS CONSOLE HPV: CHL HPV: NEGATIVE

## 2021-02-04 LAB — HM PAP SMEAR

## 2021-03-06 ENCOUNTER — Telehealth: Payer: Self-pay

## 2021-03-06 NOTE — Telephone Encounter (Signed)
MEDICATION: esomeprazole (NEXIUM) 40 MG capsule  PHARMACY: [id number] [place], [place] - [address] Phone:  [phone]  Fax:  [phone]      Comments: Patient would like to see if Dr. Mardelle Matte would prescribe this for her now.  **Let patient know to contact pharmacy at the end of the day to make sure medication is ready. **  ** Please notify patient to allow 48-72 hours to process**  **Encourage patient to contact the pharmacy for refills or they can request refills through [facility]**

## 2021-03-07 NOTE — Telephone Encounter (Signed)
Please call patient, unable to reach. She needs an appointment scheduled at least, before we can send refill

## 2021-03-07 NOTE — Telephone Encounter (Signed)
Tried contacting pt, VM is not set up.

## 2021-03-10 MED ORDER — ESOMEPRAZOLE MAGNESIUM 40 MG PO CPDR: 40.0000 mg | DELAYED_RELEASE_CAPSULE | Freq: Two times a day (BID) | ORAL | 3 refills | Status: DC

## 2021-03-10 NOTE — Telephone Encounter (Signed)
Patient's husband called back, Mrs.Spanier is scheduled for 04/03/21 asking for a short supply.

## 2021-03-10 NOTE — Addendum Note (Signed)
Addended by: Katharine Look on: 03/10/2021 02:31 PM   Modules accepted: Orders

## 2021-03-10 NOTE — Telephone Encounter (Signed)
Has been sent in  

## 2021-04-03 ENCOUNTER — Encounter: Payer: Self-pay | Admitting: Family Medicine

## 2021-04-03 ENCOUNTER — Ambulatory Visit (INDEPENDENT_AMBULATORY_CARE_PROVIDER_SITE_OTHER): Payer: BC Managed Care – PPO | Admitting: Family Medicine

## 2021-04-03 ENCOUNTER — Other Ambulatory Visit: Payer: Self-pay

## 2021-04-03 VITALS — BP 116/80 | HR 83 | Temp 98.1°F | Ht 63.0 in | Wt 216.8 lb

## 2021-04-03 DIAGNOSIS — E039 Hypothyroidism, unspecified: Secondary | ICD-10-CM | POA: Diagnosis not present

## 2021-04-03 DIAGNOSIS — I10 Essential (primary) hypertension: Secondary | ICD-10-CM | POA: Diagnosis not present

## 2021-04-03 DIAGNOSIS — Z79890 Hormone replacement therapy (postmenopausal): Secondary | ICD-10-CM

## 2021-04-03 DIAGNOSIS — E538 Deficiency of other specified B group vitamins: Secondary | ICD-10-CM

## 2021-04-03 DIAGNOSIS — F339 Major depressive disorder, recurrent, unspecified: Secondary | ICD-10-CM

## 2021-04-03 DIAGNOSIS — Z9884 Bariatric surgery status: Secondary | ICD-10-CM

## 2021-04-03 DIAGNOSIS — Z975 Presence of (intrauterine) contraceptive device: Secondary | ICD-10-CM

## 2021-04-03 DIAGNOSIS — G43009 Migraine without aura, not intractable, without status migrainosus: Secondary | ICD-10-CM

## 2021-04-03 MED ORDER — AMLODIPINE BESYLATE 10 MG PO TABS: 10.0000 mg | ORAL_TABLET | Freq: Every day | ORAL | 3 refills | Status: DC

## 2021-04-03 MED ORDER — RAMIPRIL 10 MG PO CAPS: 10.0000 mg | ORAL_CAPSULE | Freq: Every day | ORAL | 3 refills | Status: DC

## 2021-04-03 MED ORDER — VITAMIN B-12 1000 MCG PO TABS
1000.0000 ug | ORAL_TABLET | Freq: Every day | ORAL | 3 refills | Status: DC
Start: 2021-04-03 — End: 2023-02-24

## 2021-04-03 MED ORDER — ESOMEPRAZOLE MAGNESIUM 40 MG PO CPDR: 40.0000 mg | DELAYED_RELEASE_CAPSULE | Freq: Every day | ORAL | 3 refills | Status: DC

## 2021-04-03 NOTE — Patient Instructions (Signed)
Please return in 3 months for your annual complete physical; please come fasting.  Can make an appointment for headaches if needed separately.  Please return fasting for your lab work: schedule lab visit please.   I will release your lab results to you on your MyChart account with further instructions. Please reply with any questions.    If you have any questions or concerns, please don't hesitate to send me a message via MyChart or call the office at [phone]. Thank you for visiting with [place] today! It's our pleasure caring for you.

## 2021-04-03 NOTE — Progress Notes (Signed)
Subjective  CC:  Chief Complaint  Patient presents with   Hypertension   Depression   Hypothyroidism   Pt last here a year ago. Chronic problem f/u. Used telephonic interpreter for interview/visit.  HPI: Lindsay Dennis is a 50 y.o. female who presents to the office today to address the problems listed above in the chief complaint. Pt has been seen by GI and GYN since our last visit.  GI: I reviewed GI's notes. She reports she went to [place] in May and had EGD: reportedly normal. GI sxs are improved. Requests refill of nexium. Fears colonoscopy. Has not yet had crc screen Hypertension f/u: Control is good. Pt reports she is doing well. taking medications as instructed, no medication side effects noted, no TIAs, no chest pain on exertion, no dyspnea on exertion, no swelling of ankles. She denies adverse effects from his BP medications. Compliance with medication is good.  Mood: doing much better. Is working at [employer]. English has improved. More social. Has decreased effexor to 25 daily.  Needs f/u on vit deficiency: at risk due to gastric sleeve.  GYN: reports nl pap and mammogram. Has IUD in place; started on estradiol patch for hot flushes. Need gyn records.  Has h/o headaches - thinks hormones may be exacerbating headaches. No red flag sxs.  Thyroid: c/o fatigue and weight gain Declines flu vaccine.  Assessment  1. Acquired hypothyroidism   2. Essential hypertension   3. Major depression, recurrent, chronic (HCC)   4. Gastric bypass status for obesity   5. Vitamin B12 deficiency   6. Migraine without aura and without status migrainosus, not intractable   7. Hormone replacement therapy (HRT)   8. IUD (intrauterine device) in place      Plan   Hypertension f/u: BP control is well controlled. Continue ramipril 10 and amlodipine 10 daily. Check renal function and electrolytes. depression f/u: improved. Low dose effexor 25. May be able to wean off in future Check vit  deficiencies. Weight is up. May be related to menopause.  Low thyroid: due for recheck. May be low.  HRT per gyn. Has f/u in early jan. Need records. Release of records signed.   Education regarding management of these chronic disease states was given. Management strategies discussed on successive visits include dietary and exercise recommendations, goals of achieving and maintaining IBW, and lifestyle modifications aiming for adequate sleep and minimizing stressors.   I spent a total of 50 minutes for this patient encounter. Time spent included preparation, face-to-face counseling with the patient and coordination of care, review of chart and records, and documentation of the encounter.  Follow up: 3 mo for cpe.   Orders Placed This Encounter  Procedures   CBC with Differential/Platelet   Comprehensive metabolic panel   Lipid panel   TSH   VITAMIN D 25 Hydroxy (Vit-D Deficiency, Fractures)   Vitamin B12   Meds ordered this encounter  Medications   vitamin B-12 (CYANOCOBALAMIN) 1000 MCG tablet    Sig: Take 1 tablet (1,000 mcg total) by mouth daily.    Dispense:  90 tablet    Refill:  3   ramipril (ALTACE) 10 MG capsule    Sig: Take 1 capsule (10 mg total) by mouth daily.    Dispense:  90 capsule    Refill:  3   esomeprazole (NEXIUM) 40 MG capsule    Sig: Take 1 capsule (40 mg total) by mouth daily.    Dispense:  90 capsule    Refill:  3   amLODipine (NORVASC) 10 MG tablet    Sig: Take 1 tablet (10 mg total) by mouth daily.    Dispense:  90 tablet    Refill:  3      BP Readings from Last 3 Encounters:  04/03/21 116/80  05/21/20 110/80  05/04/20 124/86   Wt Readings from Last 3 Encounters:  04/03/21 216 lb 12.8 oz (98.3 kg)  05/21/20 189 lb 3.2 oz (85.8 kg)  04/22/20 193 lb 3.2 oz (87.6 kg)    Lab Results  Component Value Date   CHOL 212 (H) 10/09/2019   Lab Results  Component Value Date   HDL 46.90 10/09/2019   Lab Results  Component Value Date   LDLCALC  142 (H) 10/09/2019   Lab Results  Component Value Date   TRIG 116.0 10/09/2019   Lab Results  Component Value Date   CHOLHDL 5 10/09/2019   No results found for: LDLDIRECT Lab Results  Component Value Date   CREATININE 0.86 05/03/2020   BUN 10 05/03/2020   NA 138 05/03/2020   K 4.5 05/03/2020   CL 107 05/03/2020   CO2 22 05/03/2020    The 10-year ASCVD risk score (Arnett DK, et al., 2019) is: 1.7%   Values used to calculate the score:     Age: 38 years     Sex: Female     Is Non-Hispanic African American: No     Diabetic: No     Tobacco smoker: No     Systolic Blood Pressure: 116 mmHg     Is BP treated: Yes     HDL Cholesterol: 46.9 mg/dL     Total Cholesterol: 212 mg/dL  I reviewed the patients updated PMH, FH, and SocHx.    Patient Active Problem List   Diagnosis Date Noted   Hormone replacement therapy (HRT) 04/03/2021   Vitamin B12 deficiency 11/09/2019   Major depression, recurrent, chronic (HCC) 10/09/2019   Migraine headache without aura 10/09/2019   Essential hypertension 10/09/2019   Acquired hypothyroidism 10/09/2019   Gastric bypass status for obesity 10/09/2019   IUD (intrauterine device) in place 10/09/2019    Allergies: Levsin [hyoscyamine]  Social History: Patient  reports that she has quit smoking. She has never used smokeless tobacco. She reports that she does not drink alcohol and does not use drugs.  Current Meds  Medication Sig   Bismuth Subsalicylate 525 MG/15ML SUSP Take 15 mLs (525 mg total) by mouth every 4 (four) hours as needed (abdominal pain).   clorazepate (TRANXENE) 15 MG tablet TAKE 1 TABLET (15 MG TOTAL) BY MOUTH 3 (THREE) TIMES DAILY AS NEEDED FOR ANXIETY.   estradiol (CLIMARA - DOSED IN MG/24 HR) 0.05 mg/24hr patch Place 1 patch (0.05 mg total) onto the skin once a week.   levothyroxine (SYNTHROID) 50 MCG tablet Take 1 tablet (50 mcg total) by mouth daily.   OVER THE COUNTER MEDICATION Take 1 tablet by mouth daily. Antidol  (pt gets from [place])   [DISCONTINUED] amLODipine (NORVASC) 10 MG tablet Take 10 mg by mouth daily.   [DISCONTINUED] CAPTOPRIL PO Place 25 mg under the tongue See admin instructions. As needed for low blood pressure   [DISCONTINUED] esomeprazole (NEXIUM) 40 MG capsule Take 1 capsule (40 mg total) by mouth 2 (two) times daily before a meal.   [DISCONTINUED] estradiol (ESTRACE) 0.5 MG tablet Take 0.5 mg by mouth daily.   [DISCONTINUED] levonorgestrel (MIRENA) 20 MCG/24HR IUD 1 Intra Uterine Device (1 each total) by Intrauterine route  once for 1 dose.   [DISCONTINUED] RAMIPRIL PO Take by mouth.   [DISCONTINUED] vitamin B-12 (CYANOCOBALAMIN) 1000 MCG tablet Take 1 tablet (1,000 mcg total) by mouth daily.    Review of Systems: Cardiovascular: negative for chest pain, palpitations, leg swelling, orthopnea Respiratory: negative for SOB, wheezing or persistent cough Gastrointestinal: negative for abdominal pain Genitourinary: negative for dysuria or gross hematuria  Objective  Vitals: BP 116/80   Pulse 83   Temp 98.1 F (36.7 C) (Temporal)   Ht 5\' 3"  (1.6 m)   Wt 216 lb 12.8 oz (98.3 kg)   SpO2 98%   BMI 38.40 kg/m  General: no acute distress  Psych:  Alert and oriented, normal mood and affect HEENT:  Normocephalic, atraumatic, supple neck  Cardiovascular:  RRR without murmur. no edema Respiratory:  Good breath sounds bilaterally, CTAB with normal respiratory effort Skin:  Warm, no rashes Neurologic:   Mental status is normal Commons side effects, risks, benefits, and alternatives for medications and treatment plan prescribed today were discussed, and the patient expressed understanding of the given instructions. Patient is instructed to call or message via MyChart if he/she has any questions or concerns regarding our treatment plan. No barriers to understanding were identified. We discussed Red Flag symptoms and signs in detail. Patient expressed understanding regarding what to do in case  of urgent or emergency type symptoms.  Medication list was reconciled, printed and provided to the patient in AVS. Patient instructions and summary information was reviewed with the patient as documented in the AVS. This note was prepared with assistance of Dragon voice recognition software. Occasional wrong-word or sound-a-like substitutions may have occurred due to the inherent limitations of voice recognition software  This visit occurred during the SARS-CoV-2 public health emergency.  Safety protocols were in place, including screening questions prior to the visit, additional usage of staff PPE, and extensive cleaning of exam room while observing appropriate contact time as indicated for disinfecting solutions.

## 2021-04-04 ENCOUNTER — Other Ambulatory Visit: Payer: BC Managed Care – PPO

## 2021-04-07 ENCOUNTER — Other Ambulatory Visit: Payer: BC Managed Care – PPO

## 2021-04-08 ENCOUNTER — Other Ambulatory Visit: Payer: Self-pay

## 2021-04-08 ENCOUNTER — Other Ambulatory Visit (INDEPENDENT_AMBULATORY_CARE_PROVIDER_SITE_OTHER): Payer: BC Managed Care – PPO

## 2021-04-08 ENCOUNTER — Other Ambulatory Visit: Payer: Self-pay | Admitting: Family Medicine

## 2021-04-08 DIAGNOSIS — E039 Hypothyroidism, unspecified: Secondary | ICD-10-CM

## 2021-04-08 DIAGNOSIS — E538 Deficiency of other specified B group vitamins: Secondary | ICD-10-CM

## 2021-04-08 DIAGNOSIS — I10 Essential (primary) hypertension: Secondary | ICD-10-CM | POA: Diagnosis not present

## 2021-04-08 LAB — CBC WITH DIFFERENTIAL/PLATELET
Basophils Absolute: 0 10*3/uL (ref 0.0–0.1)
Basophils Relative: 0.8 % (ref 0.0–3.0)
Eosinophils Absolute: 0.2 10*3/uL (ref 0.0–0.7)
Eosinophils Relative: 3.8 % (ref 0.0–5.0)
HCT: 41.1 % (ref 36.0–46.0)
Hemoglobin: 13.5 g/dL (ref 12.0–15.0)
Lymphocytes Relative: 44.2 % (ref 12.0–46.0)
Lymphs Abs: 1.9 10*3/uL (ref 0.7–4.0)
MCHC: 32.7 g/dL (ref 30.0–36.0)
MCV: 83.1 fl (ref 78.0–100.0)
Monocytes Absolute: 0.3 10*3/uL (ref 0.1–1.0)
Monocytes Relative: 7.9 % (ref 3.0–12.0)
Neutro Abs: 1.9 10*3/uL (ref 1.4–7.7)
Neutrophils Relative %: 43.3 % (ref 43.0–77.0)
Platelets: 292 10*3/uL (ref 150.0–400.0)
RBC: 4.95 Mil/uL (ref 3.87–5.11)
RDW: 14.5 % (ref 11.5–15.5)
WBC: 4.3 10*3/uL (ref 4.0–10.5)

## 2021-04-08 LAB — COMPREHENSIVE METABOLIC PANEL
ALT: 10 U/L (ref 0–35)
AST: 12 U/L (ref 0–37)
Albumin: 3.9 g/dL (ref 3.5–5.2)
Alkaline Phosphatase: 37 U/L — ABNORMAL LOW (ref 39–117)
BUN: 10 mg/dL (ref 6–23)
CO2: 26 mEq/L (ref 19–32)
Calcium: 9.3 mg/dL (ref 8.4–10.5)
Chloride: 109 mEq/L (ref 96–112)
Creatinine, Ser: 0.83 mg/dL (ref 0.40–1.20)
GFR: 82.71 mL/min (ref >60.00)
Glucose, Bld: 93 mg/dL (ref 70–99)
Potassium: 4.4 mEq/L (ref 3.5–5.1)
Sodium: 142 mEq/L (ref 135–145)
Total Bilirubin: 0.3 mg/dL (ref 0.2–1.2)
Total Protein: 6.6 g/dL (ref 6.0–8.3)

## 2021-04-08 LAB — LIPID PANEL
Cholesterol: 221 mg/dL — ABNORMAL HIGH (ref 0–200)
HDL: 54.6 mg/dL (ref >39.00)
LDL Cholesterol: 145 mg/dL — ABNORMAL HIGH (ref 0–99)
NonHDL: 166.55
Total CHOL/HDL Ratio: 4
Triglycerides: 106 mg/dL (ref 0.0–149.0)
VLDL: 21.2 mg/dL (ref 0.0–40.0)

## 2021-04-08 LAB — VITAMIN B12: Vitamin B-12: 968 pg/mL — ABNORMAL HIGH (ref 211–911)

## 2021-04-08 LAB — TSH: TSH: 1.91 u[IU]/mL (ref 0.35–5.50)

## 2021-04-08 LAB — VITAMIN D 25 HYDROXY (VIT D DEFICIENCY, FRACTURES): VITD: 34.72 ng/mL (ref 30.00–100.00)

## 2021-04-14 ENCOUNTER — Ambulatory Visit: Payer: BC Managed Care – PPO | Admitting: Family Medicine

## 2021-04-17 ENCOUNTER — Other Ambulatory Visit: Payer: Self-pay

## 2021-04-17 ENCOUNTER — Other Ambulatory Visit: Payer: Self-pay | Admitting: Family Medicine

## 2021-04-17 ENCOUNTER — Ambulatory Visit: Payer: BC Managed Care – PPO | Admitting: Family Medicine

## 2021-04-17 ENCOUNTER — Encounter: Payer: Self-pay | Admitting: Family Medicine

## 2021-04-17 VITALS — BP 124/76 | HR 91 | Temp 98.3°F | Ht 63.0 in | Wt 210.2 lb

## 2021-04-17 DIAGNOSIS — K219 Gastro-esophageal reflux disease without esophagitis: Secondary | ICD-10-CM | POA: Diagnosis not present

## 2021-04-17 DIAGNOSIS — G43009 Migraine without aura, not intractable, without status migrainosus: Secondary | ICD-10-CM | POA: Diagnosis not present

## 2021-04-17 DIAGNOSIS — Z79890 Hormone replacement therapy (postmenopausal): Secondary | ICD-10-CM | POA: Diagnosis not present

## 2021-04-17 DIAGNOSIS — G4440 Drug-induced headache, not elsewhere classified, not intractable: Secondary | ICD-10-CM

## 2021-04-17 DIAGNOSIS — L609 Nail disorder, unspecified: Secondary | ICD-10-CM

## 2021-04-17 DIAGNOSIS — T3995XA Adverse effect of unspecified nonopioid analgesic, antipyretic and antirheumatic, initial encounter: Secondary | ICD-10-CM

## 2021-04-17 MED ORDER — RIZATRIPTAN BENZOATE 10 MG PO TBDP: 10.0000 mg | ORAL_TABLET | Freq: Every day | ORAL | 0 refills | Status: DC | PRN

## 2021-04-17 MED ORDER — OMEPRAZOLE 40 MG PO CPDR: 40.0000 mg | DELAYED_RELEASE_CAPSULE | Freq: Every day | ORAL | 3 refills | Status: DC

## 2021-04-17 NOTE — Progress Notes (Signed)
Subjective  CC:  Chief Complaint  Patient presents with   Nail Problem   Gastroesophageal Reflux    Nexium is not covered, wants to discuss changing to something different   Headache    Discuss headaches    HPI: Lindsay Dennis is a 50 y.o. female who presents to the office today to address the problems listed above in the chief complaint. 50 year old [place] female, husband helps with interpreting today.  Patient declines phone interpreter.  She reports she has tender nails break easily.  She believes it is due to psoriasis.  This runs in her family.  She denies skin outbreaks.  She would like to see dermatologist.  She recently had fake nails put on and has residue left. Headaches: Patient has long history of headaches.  She reports she was seen by neurology when she lived in [place].  She reports she had a normal brain MRI at that time.  Records are not available.  She describes migrainous headaches that are triggered by hormones and weather changes: Mostly bilateral although sometimes unilateral headaches that would be associated with photophobia and nausea and vomiting.  She reports she was on a preventive medicine, and antiepileptic but cannot member the name.  She does not feel that she is ever been treated with triptans but is unsure.  Currently she is getting migrainous headaches once to twice weekly.  She feels this is mostly related to weather changes, however she recently started HRT and feels headache frequency has increased because of this.  She recently stopped the estradiol patch and feels that that is helping.  She also describes tension/stress headaches, bitemporal and occipital, these headaches are mostly related to stress.  She takes Tylenol and Advil for these.  She will take Excedrin Migraine for her migraines.  At this point, she is taking pain medicines daily.  She has been doing this for several months.  She denies diplopia, dysarthria or hemiparesis.  No severe headaches.   She sleeps well. GERD: Has been on Nexium per her GI doctor and colon.  Had recent EGD this summer that she reports was normal.  Current symptoms are well controlled, however insurance does not cover Nexium.  She needs a different PPI.  She reports she is use different PPIs with success in the past.  Assessment  1. Migraine without aura and without status migrainosus, not intractable   2. Gastroesophageal reflux disease without esophagitis   3. Analgesic rebound headache   4. Hormone replacement therapy (HRT)   5. Nail abnormality      Plan  Headaches: Combination headaches.  Has component of analgesic rebound headache with tension and stress headaches.  Some headaches do sound migrainous.  She does carry that diagnosis.  Discussed management of each.  Recommend stopping daily medications.  Trial Maxalt for migrainous headaches.  Stress reduction with behavioral management.  We will follow-up in 4 to 6 weeks and assess need for preventatives at that time.  She will follow-up with her gynecologist for other options to treat her hot flashes.  She is stopping her estradiol patches. GERD: Change to Protonix 40 mg daily and monitor for symptom control. Patient requesting dermatology referral for nail problem.  Discussed some of the problems are due to the false nails.  Follow up: 6 weeks to recheck headaches. 07/02/2021  No orders of the defined types were placed in this encounter.  Meds ordered this encounter  Medications   omeprazole (PRILOSEC) 40 MG capsule    Sig: Take  1 capsule (40 mg total) by mouth daily.    Dispense:  90 capsule    Refill:  3   rizatriptan (MAXALT-MLT) 10 MG disintegrating tablet    Sig: Take 1 tablet (10 mg total) by mouth daily as needed for migraine. May repeat in 2 hours if needed    Dispense:  15 tablet    Refill:  0      I reviewed the patients updated PMH, FH, and SocHx.    Patient Active Problem List   Diagnosis Date Noted   Gastroesophageal reflux  disease without esophagitis 04/17/2021   Hormone replacement therapy (HRT) 04/03/2021   Vitamin B12 deficiency 11/09/2019   Major depression, recurrent, chronic (HCC) 10/09/2019   Migraine headache without aura 10/09/2019   Essential hypertension 10/09/2019   Acquired hypothyroidism 10/09/2019   Gastric bypass status for obesity 10/09/2019   IUD (intrauterine device) in place 10/09/2019   Current Meds  Medication Sig   amLODipine (NORVASC) 10 MG tablet Take 1 tablet (10 mg total) by mouth daily.   Bismuth Subsalicylate 525 MG/15ML SUSP Take 15 mLs (525 mg total) by mouth every 4 (four) hours as needed (abdominal pain).   clorazepate (TRANXENE) 15 MG tablet TAKE 1 TABLET (15 MG TOTAL) BY MOUTH 3 (THREE) TIMES DAILY AS NEEDED FOR ANXIETY.   estradiol (VIVELLE-DOT) 0.05 MG/24HR patch Place 1 patch onto the skin 2 (two) times a week.   levothyroxine (SYNTHROID) 50 MCG tablet Take 1 tablet (50 mcg total) by mouth daily.   omeprazole (PRILOSEC) 40 MG capsule Take 1 capsule (40 mg total) by mouth daily.   OVER THE COUNTER MEDICATION Take 1 tablet by mouth daily. Antidol (pt gets from [place])   ramipril (ALTACE) 10 MG capsule Take 1 capsule (10 mg total) by mouth daily.   rizatriptan (MAXALT-MLT) 10 MG disintegrating tablet Take 1 tablet (10 mg total) by mouth daily as needed for migraine. May repeat in 2 hours if needed   venlafaxine (EFFEXOR) 100 MG tablet Take 0.5 tablets (50 mg total) by mouth daily.   vitamin B-12 (CYANOCOBALAMIN) 1000 MCG tablet Take 1 tablet (1,000 mcg total) by mouth daily.   [DISCONTINUED] esomeprazole (NEXIUM) 40 MG capsule Take 1 capsule (40 mg total) by mouth daily.    Allergies: Patient is allergic to levsin [hyoscyamine]. Family History: Patient family history includes Lung cancer in her maternal grandmother. Social History:  Patient  reports that she has quit smoking. She has never used smokeless tobacco. She reports that she does not drink alcohol and does not  use drugs.  Review of Systems: Constitutional: Negative for fever malaise or anorexia Cardiovascular: negative for chest pain Respiratory: negative for SOB or persistent cough Gastrointestinal: negative for abdominal pain  Objective  Vitals: BP 124/76 Comment: by home readings   Pulse 91    Temp 98.3 F (36.8 C) (Temporal)    Ht 5\' 3"  (1.6 m)    Wt 210 lb 3.2 oz (95.3 kg)    SpO2 98%    BMI 37.24 kg/m  General: no acute distress , A&Ox3 HEENT: PEERL, conjunctiva normal, neck is supple Nails: Mechanical damage due to false nails present.  Some pitting.  Inflamed cuticles.    Commons side effects, risks, benefits, and alternatives for medications and treatment plan prescribed today were discussed, and the patient expressed understanding of the given instructions. Patient is instructed to call or message via MyChart if he/she has any questions or concerns regarding our treatment plan. No barriers to understanding were identified.  We discussed Red Flag symptoms and signs in detail. Patient expressed understanding regarding what to do in case of urgent or emergency type symptoms.  Medication list was reconciled, printed and provided to the patient in AVS. Patient instructions and summary information was reviewed with the patient as documented in the AVS. This note was prepared with assistance of Dragon voice recognition software. Occasional wrong-word or sound-a-like substitutions may have occurred due to the inherent limitations of voice recognition software  This visit occurred during the SARS-CoV-2 public health emergency.  Safety protocols were in place, including screening questions prior to the visit, additional usage of staff PPE, and extensive cleaning of exam room while observing appropriate contact time as indicated for disinfecting solutions.

## 2021-04-17 NOTE — Patient Instructions (Signed)
Please follow up in 6 weeks to recheck headaches.  I refilled your blood pressures medicines on 12/8. [facility] be there at [facility].   I've ordered maxalt: this is a migraine medication. Take at the onset of a migraine headache to resolve it. This should be used rarely.  Stop taking daily medication for headaches.  Work on stress reduction and massage for headaches.  I've ordered omeprazole 40 for you.   If you have any questions or concerns, please don't hesitate to send me a message via MyChart or call the office at [phone]. Thank you for visiting with Lindsay Dennis today! It's our pleasure caring for you.   Analgesic Rebound Headache An analgesic rebound headache, sometimes called a medication overuse headache or a drug-induced headache, is a secondary disorder that is caused by the overuse of pain medicine (analgesic) to treat the original (primary) headache. Any type of primary headache can return as a rebound headache if a person regularly takes analgesics. The types of primary headaches that are commonly associated with rebound headaches include: Migraines. Headaches that are caused by tense muscles in the head and neck area (tension headaches). Headaches that develop and happen again on one side of the head and around the eye (cluster headaches). If rebound headaches continue, they can become long-term, daily headaches. What are the causes? This condition may be caused by frequent use of: Over-the-counter medicines such as aspirin, ibuprofen, and acetaminophen. Sinus-relief medicines and medicines that contain caffeine. Narcotic pain medicines such as codeine and oxycodone. Some prescription migraine medicines. What are the signs or symptoms? The symptoms of a rebound headache are the same as the symptoms of the original headache. Some of the symptoms of specific types of headaches include: Migraine headache Pulsing or throbbing pain on one or both sides of the head. Severe pain that  interferes with daily activities. Pain that gets worse with physical activity. Nausea, vomiting, or both. Pain and sensitivity with exposure to bright light, loud noises, or strong smells. Visual changes. Numbness of one or both arms. Tension headache Pressure around the head. Dull, aching head pain. Pain felt over the front and sides of the head. Tenderness in the muscles of the head, neck, and shoulders. Cluster headache Severe pain that begins in or around one eye or temple. Droopy or swollen eyelid, or redness and tearing in the eye on the same side as the pain. One-sided head pain. Nausea. Runny nose. Sweaty, pale facial skin. Restlessness. How is this diagnosed? This condition is diagnosed by: Reviewing your medical history. This includes the nature of your primary headaches. Reviewing the types of pain medicines that you have been using to treat your primary headaches and how often you take them. How is this treated? This condition may be treated or managed by: Discontinuing frequent use of the analgesic medicine. Doing this may worsen your headaches at first, but the pain should eventually become more manageable, less frequent, and less severe. Seeing a headache specialist. He or she may be able to help you manage your headaches and help make sure there is not another cause of the headaches. Using methods of stress relief, such as acupuncture, counseling, biofeedback, and massage. Talk with your health care provider about which methods might be good for you. Follow these instructions at home: Medicines  Take over-the-counter and prescription medicines only as told by your health care provider. Stop the repeated use of pain medicine as told by your health care provider. Stopping can be difficult. Carefully follow instructions  from your health care provider. Lifestyle  Follow a regular sleep schedule. Do not vary the time that you go to bed or the amount that you sleep from  day to day. It is important to stay on the same schedule to help prevent headaches. Get 7-9 hours of sleep each night, or the amount recommended by your health care provider. Exercise regularly. Exercise for at least 30 minutes, 5 times each week. Limit or manage stress. Consider stress-relief options such as acupuncture, counseling, biofeedback, and massage. Talk with your health care provider about which methods might be good for you. Do not drink alcohol. Do not use any products that contain nicotine or tobacco, such as cigarettes, e-cigarettes, and chewing tobacco. If you need help quitting, ask your health care provider. General instructions Avoid triggers that are known to cause your primary headaches. Keep all follow-up visits as told by your health care provider. This is important. Contact a health care provider if: You continue to experience headaches after following treatments that your health care provider recommended. Get help right away if you have: New headache pain. Headache pain that is different than what you have experienced in the past. Numbness or tingling in your arms or legs. Changes in your speech or vision. Summary An analgesic rebound headache, sometimes called a medication overuse headache or a drug-induced headache, is caused by the overuse of pain medicine (analgesic) to treat the original (primary) headache. Any type of primary headache can return as a rebound headache if a person regularly takes analgesics. The types of primary headaches that are commonly associated with rebound headaches include migraines, tension headaches, and cluster headaches. Analgesic rebound headaches can occur with frequent use of over-the-counter medicines and prescription medicines. Treatment involves stopping the medicine that is being overused. This will improve headache frequency and severity. This information is not intended to replace advice given to you by your health care provider.  Make sure you discuss any questions you have with your health care provider. Document Revised: 05/11/2019 Document Reviewed: 05/11/2019 Elsevier Patient Education  2022 [employer].

## 2021-04-24 ENCOUNTER — Telehealth: Payer: Self-pay | Admitting: Family Medicine

## 2021-04-24 NOTE — Telephone Encounter (Signed)
Patient called stating that the derm office hasnt received the referral for patient Patient said that they gave them a fax and said for [place] to do it that way if we could  [phone]

## 2021-04-29 NOTE — Telephone Encounter (Signed)
Referral has been faxed over twice, their referral coordinator is out of the office at the moment. Toma Copier is unable to give [place] any referral information since the coordinator is not in office.

## 2021-05-02 ENCOUNTER — Ambulatory Visit: Payer: BC Managed Care – PPO | Admitting: Family Medicine

## 2021-05-05 DIAGNOSIS — M545 Low back pain, unspecified: Secondary | ICD-10-CM | POA: Insufficient documentation

## 2021-06-23 ENCOUNTER — Other Ambulatory Visit: Payer: Self-pay | Admitting: Family Medicine

## 2021-07-02 ENCOUNTER — Other Ambulatory Visit: Payer: Self-pay

## 2021-07-02 ENCOUNTER — Ambulatory Visit (INDEPENDENT_AMBULATORY_CARE_PROVIDER_SITE_OTHER): Payer: BC Managed Care – PPO | Admitting: Family Medicine

## 2021-07-02 ENCOUNTER — Encounter: Payer: Self-pay | Admitting: Family Medicine

## 2021-07-02 ENCOUNTER — Telehealth: Payer: Self-pay | Admitting: Family Medicine

## 2021-07-02 VITALS — BP 113/69 | HR 71 | Temp 97.5°F | Ht 63.0 in | Wt 219.6 lb

## 2021-07-02 DIAGNOSIS — F339 Major depressive disorder, recurrent, unspecified: Secondary | ICD-10-CM | POA: Diagnosis not present

## 2021-07-02 DIAGNOSIS — Z1212 Encounter for screening for malignant neoplasm of rectum: Secondary | ICD-10-CM

## 2021-07-02 DIAGNOSIS — G43009 Migraine without aura, not intractable, without status migrainosus: Secondary | ICD-10-CM

## 2021-07-02 DIAGNOSIS — E538 Deficiency of other specified B group vitamins: Secondary | ICD-10-CM

## 2021-07-02 DIAGNOSIS — Z0000 Encounter for general adult medical examination without abnormal findings: Secondary | ICD-10-CM | POA: Diagnosis not present

## 2021-07-02 DIAGNOSIS — E039 Hypothyroidism, unspecified: Secondary | ICD-10-CM

## 2021-07-02 DIAGNOSIS — I10 Essential (primary) hypertension: Secondary | ICD-10-CM | POA: Diagnosis not present

## 2021-07-02 DIAGNOSIS — Z1211 Encounter for screening for malignant neoplasm of colon: Secondary | ICD-10-CM

## 2021-07-02 DIAGNOSIS — E669 Obesity, unspecified: Secondary | ICD-10-CM

## 2021-07-02 DIAGNOSIS — Z9884 Bariatric surgery status: Secondary | ICD-10-CM | POA: Diagnosis not present

## 2021-07-02 LAB — CBC WITH DIFFERENTIAL/PLATELET
Basophils Absolute: 0 10*3/uL (ref 0.0–0.1)
Basophils Relative: 0.6 % (ref 0.0–3.0)
Eosinophils Absolute: 0.2 10*3/uL (ref 0.0–0.7)
Eosinophils Relative: 3.5 % (ref 0.0–5.0)
HCT: 41 % (ref 36.0–46.0)
Hemoglobin: 13.5 g/dL (ref 12.0–15.0)
Lymphocytes Relative: 34.2 % (ref 12.0–46.0)
Lymphs Abs: 1.9 10*3/uL (ref 0.7–4.0)
MCHC: 33 g/dL (ref 30.0–36.0)
MCV: 83.5 fl (ref 78.0–100.0)
Monocytes Absolute: 0.4 10*3/uL (ref 0.1–1.0)
Monocytes Relative: 7.2 % (ref 3.0–12.0)
Neutro Abs: 3.1 10*3/uL (ref 1.4–7.7)
Neutrophils Relative %: 54.5 % (ref 43.0–77.0)
Platelets: 300 10*3/uL (ref 150.0–400.0)
RBC: 4.9 Mil/uL (ref 3.87–5.11)
RDW: 15.2 % (ref 11.5–15.5)
WBC: 5.7 10*3/uL (ref 4.0–10.5)

## 2021-07-02 LAB — COMPREHENSIVE METABOLIC PANEL
ALT: 11 U/L (ref 0–35)
AST: 13 U/L (ref 0–37)
Albumin: 4 g/dL (ref 3.5–5.2)
Alkaline Phosphatase: 37 U/L — ABNORMAL LOW (ref 39–117)
BUN: 14 mg/dL (ref 6–23)
CO2: 27 mEq/L (ref 19–32)
Calcium: 9.2 mg/dL (ref 8.4–10.5)
Chloride: 107 mEq/L (ref 96–112)
Creatinine, Ser: 0.74 mg/dL (ref 0.40–1.20)
GFR: 94.77 mL/min (ref >60.00)
Glucose, Bld: 96 mg/dL (ref 70–99)
Potassium: 4.7 mEq/L (ref 3.5–5.1)
Sodium: 141 mEq/L (ref 135–145)
Total Bilirubin: 0.3 mg/dL (ref 0.2–1.2)
Total Protein: 6.4 g/dL (ref 6.0–8.3)

## 2021-07-02 LAB — LIPID PANEL
Cholesterol: 247 mg/dL — ABNORMAL HIGH (ref 0–200)
HDL: 58.7 mg/dL (ref >39.00)
LDL Cholesterol: 163 mg/dL — ABNORMAL HIGH (ref 0–99)
NonHDL: 188.19
Total CHOL/HDL Ratio: 4
Triglycerides: 127 mg/dL (ref 0.0–149.0)
VLDL: 25.4 mg/dL (ref 0.0–40.0)

## 2021-07-02 LAB — VITAMIN B12: Vitamin B-12: 499 pg/mL (ref 211–911)

## 2021-07-02 LAB — TSH: TSH: 1.72 u[IU]/mL (ref 0.35–5.50)

## 2021-07-02 MED ORDER — PROPRANOLOL HCL ER 120 MG PO CP24: 120.0000 mg | ORAL_CAPSULE | Freq: Every day | ORAL | 3 refills | Status: DC

## 2021-07-02 MED ORDER — MELOXICAM 15 MG PO TABS: 15.0000 mg | ORAL_TABLET | Freq: Every day | ORAL | 2 refills | Status: DC | PRN

## 2021-07-02 NOTE — Telephone Encounter (Signed)
Sent to [facility] [address] 06/25/2021. ?

## 2021-07-02 NOTE — Progress Notes (Signed)
Subjective  Chief Complaint  Patient presents with   Annual Exam   Hypothyroidism   Hypertension    HPI: Lindsay Dennis is a 51 y.o. female who presents to [facility] [place] today for a Female Wellness Visit. She also has the concerns and/or needs as listed above in the chief complaint. These will be addressed in addition to the Health Maintenance Visit.   Wellness Visit: annual visit with health maintenance review and exam without Pap  Health maintenance: Eligible for colon cancer screening.  She does not want to have colonoscopy.  She has a distant family history of colon cancer in an aunt and distant cousins.  Other screens are current.  Immunizations are current. Chronic disease f/u and/or acute problem visit: (deemed necessary to be done in addition to the wellness visit): Headaches: See last note.  Fortunately she is doing much better.  She is no longer taking over-the-counter medications.  However she is using Imitrex more frequently than we like.  She has had several weeks without headaches but during the weather changes, she notices increasing frequency of right-sided headaches associated with nausea.  Imitrex is helpful.  No new symptoms. Hypertension and hypothyroidism are well controlled on current medications.  She is due for recheck.  She feels her energy levels are good.  No signs or symptoms of low thyroid. Major depression: Overall she is doing much better.  She has self weaned her Zoloft to 50 mg daily.  She was taking 200 mg daily 6 months ago.  She feels that her mood is well controlled. Review of systems: Worrisome for significant weight gain over the last year.  Diet related.   Assessment  1. Annual physical exam   2. Migraine without aura and without status migrainosus, not intractable   3. Essential hypertension   4. Acquired hypothyroidism   5. Major depression, recurrent, chronic (HCC)   6. Vitamin B12 deficiency   7. Screening for  colorectal cancer      Plan  Female Wellness Visit: Age appropriate Health Maintenance and Prevention measures were discussed with patient. Included topics are cancer screening recommendations, ways to keep healthy (see AVS) including dietary and exercise recommendations, regular eye and dental care, use of seat belts, and avoidance of moderate alcohol use and tobacco use.  Discussed options for colon cancer screening.  Patient declines colonoscopy but will do Cologuard.  She understands is not a perfect test. BMI: discussed patient's BMI and encouraged positive lifestyle modifications to help get to or maintain a target BMI. HM needs and immunizations were addressed and ordered. See below for orders. See HM and immunization section for updates. Routine labs and screening tests ordered including cmp, cbc and lipids where appropriate. Discussed recommendations regarding Vit D and calcium supplementation (see AVS)  Chronic disease management visit and/or acute problem visit: Hypertension: Remains well controlled but given frequent migraines, will stop amlodipine and change to propranolol 120 daily.  This is for blood pressure and migraine prevention.  Education given.  We will recheck in 3 months.  Continue her ACE inhibitor.  Recheck renal function electrolytes. Discussed goals of prophylactic migraine medicines.  Continue Imitrex as needed. Recheck thyroid levels.  Clinically euthyroid Depression is well controlled on 50 mg of Zoloft daily.  Follow up: 8 to 12 weeks to recheck blood pressure and migraines. Orders Placed This Encounter  Procedures   CBC with Differential/Platelet   Comprehensive metabolic panel   Lipid panel   TSH   Vitamin  B12   No orders of the defined types were placed in this encounter.     Body mass index is 38.9 kg/m.  Recommendation Wt Readings from Last 3 Encounters:  07/02/21 219 lb 9.6 oz (99.6 kg)  04/17/21 210 lb 3.2 oz (95.3 kg)  04/03/21 216 lb 12.8 oz  (98.3 kg)     Patient Active Problem List   Diagnosis Date Noted   Gastroesophageal reflux disease without esophagitis 04/17/2021    EGD in [place] 2022; pt reports normal. On chronic PPI    Hormone replacement therapy (HRT) 04/03/2021   Vitamin B12 deficiency 11/09/2019   Major depression, recurrent, chronic (HCC) 10/09/2019   Migraine headache without aura 10/09/2019   Essential hypertension 10/09/2019   Acquired hypothyroidism 10/09/2019   Gastric bypass status for obesity 10/09/2019    Gastric sleeve: 2004    IUD (intrauterine device) in place 10/09/2019    mirena placed 2018 in [place].  3rd IUD    Health Maintenance  Topic Date Due   HIV Screening  Never done   Hepatitis C Screening  Never done   COLONOSCOPY (Pts 45-79yrs Insurance coverage will need to be confirmed)  Never done   COVID-19 Vaccine (3 - Booster for Pfizer series) 11/03/2019   INFLUENZA VACCINE  07/25/2021 (Originally 11/25/2020)   TETANUS/TDAP  04/03/2022 (Originally 09/26/1990)   PAP SMEAR-Modifier  01/25/2026   HPV VACCINES  Aged Out   Immunization History  Administered Date(s) Administered   PFIZER(Purple Top)SARS-COV-2 Vaccination 08/18/2019, 09/08/2019   We updated and reviewed the patient's past history in detail and it is documented below. Allergies: Patient is allergic to levsin [hyoscyamine]. Past Medical History Patient  has a past medical history of Acquired hypothyroidism (10/09/2019), Essential hypertension (10/09/2019), Major depression, recurrent, chronic (HCC) (10/09/2019), and Migraine headache without aura (10/09/2019). Past Surgical History Patient  has a past surgical history that includes Sleeve Gastroplasty (2018) and Cholecystectomy (2004). Family History: Patient family history includes Lung cancer in her maternal grandmother. Social History:  Patient  reports that she has quit smoking. She has never used smokeless tobacco. She reports that she does not drink alcohol and does not use  drugs.  Review of Systems: Constitutional: negative for fever or malaise Ophthalmic: negative for photophobia, double vision or loss of vision Cardiovascular: negative for chest pain, dyspnea on exertion, or new LE swelling Respiratory: negative for SOB or persistent cough Gastrointestinal: negative for abdominal pain, change in bowel habits or melena Genitourinary: negative for dysuria or gross hematuria, no abnormal uterine bleeding or disharge Musculoskeletal: negative for new gait disturbance or muscular weakness Integumentary: negative for new or persistent rashes, no breast lumps Neurological: negative for TIA or stroke symptoms Psychiatric: negative for SI or delusions Allergic/Immunologic: negative for hives  Patient Care Team    Relationship Specialty Notifications Start End  Willow Ora, MD PCP - General Family Medicine  10/09/19   Armbruster, Willaim Rayas, MD Consulting Physician Gastroenterology  04/03/21   Charlett Nose, MD Consulting Physician Obstetrics and Gynecology  04/03/21     Objective  Vitals: Temp (!) 97.5 F (36.4 C) (Temporal)    Ht 5\' 3"  (1.6 m)    Wt 219 lb 9.6 oz (99.6 kg)    LMP  (LMP Unknown) Comment: Mirena   BMI 38.90 kg/m  General:  Well developed, well nourished, no acute distress  Psych:  Alert and orientedx3,normal mood and affect HEENT:  Normocephalic, atraumatic, non-icteric sclera,  supple neck without adenopathy, mass or thyromegaly Cardiovascular:  Normal  S1, S2, RRR without gallop, rub or murmur Respiratory:  Good breath sounds bilaterally, CTAB with normal respiratory effort Gastrointestinal: normal bowel sounds, soft, non-tender, no noted masses. No HSM MSK: no deformities, contusions. Joints are without erythema or swelling.  Skin:  Warm, no rashes or suspicious lesions noted Neurologic:    Mental status is normal. CN 2-11 are normal. Gross motor and sensory exams are normal. Normal gait. No tremor   Commons side effects, risks,  benefits, and alternatives for medications and treatment plan prescribed today were discussed, and the patient expressed understanding of the given instructions. Patient is instructed to call or message via MyChart if he/she has any questions or concerns regarding our treatment plan. No barriers to understanding were identified. We discussed Red Flag symptoms and signs in detail. Patient expressed understanding regarding what to do in case of urgent or emergency type symptoms.  Medication list was reconciled, printed and provided to the patient in AVS. Patient instructions and summary information was reviewed with the patient as documented in the AVS. This note was prepared with assistance of Dragon voice recognition software. Occasional wrong-word or sound-a-like substitutions may have occurred due to the inherent limitations of voice recognition software  This visit occurred during the SARS-CoV-2 public health emergency.  Safety protocols were in place, including screening questions prior to the visit, additional usage of staff PPE, and extensive cleaning of exam room while observing appropriate contact time as indicated for disinfecting solutions.

## 2021-07-02 NOTE — Patient Instructions (Signed)
Please return in 8-12 weeks for blood pressure and headache recheck and to discuss weight (or can come back sooner to discuss weight loss alone). ? ? I will release your lab results to you on your MyChart account with further instructions. You may see the results before I do, but when I review them I will send you a message with my report or have my assistant call you if things need to be discussed. Please reply to my message with any questions. Thank you!  ? ?I recommend the Cologuard test for your colon cancer screening that is due. I have ordered this test for you. The [facility] will soon contact you to verify your insurance, address etc. They will then send you the kit; follow the instructions in the kit and return the kit to Cologuard. They will run the test and send the results to me. I will then give you the results. If this test is negative, we recommend repeating a colon cancer screening test in 3 years. If it is positive, I will refer you to a Gastroenterologist so you can get set up for the recommended colonoscopy.  ?Thank you!  ? ?If you have any questions or concerns, please don't hesitate to send me a message via MyChart or call the office at [phone]. Thank you for visiting with [place] today! It's our pleasure caring for you.  ? ?Please do these things to maintain good health! ? ?Exercise at least 30-45 minutes a day,  4-5 days a week.  ?Eat a low-fat diet with lots of fruits and vegetables, up to 7-9 servings per day. ?Drink plenty of water daily. Try to drink 8 8oz glasses per day. ?Seatbelts can save your life. Always wear your seatbelt. ?Place Smoke Detectors on every level of your home and check batteries every year. ?Schedule an appointment with an eye doctor for an eye exam every 1-2 years ?Safe sex - use condoms to protect yourself from STDs if you could be exposed to these types of infections. Use birth control if you do not want to become pregnant and are sexually active. ?Avoid  heavy alcohol use. If you drink, keep it to less than 2 drinks/day and not every day. ?[place].  Choose someone you trust that could speak for you if you became unable to speak for yourself. ?Depression is common in our stressful world.If you're feeling down or losing interest in things you normally enjoy, please come in for a visit. ?If anyone is threatening or hurting you, please get help. Physical or Emotional Violence is never OK.   ?

## 2021-07-02 NOTE — Telephone Encounter (Signed)
Pt was seen today but forgot to ask for refills for her other medication.  ? ?MEDICATION:SUMAtriptan (IMITREX) 50 MG tablet ? ?PHARMACY: ?[id number] - [place], [place] Phone:  [phone]  ?Fax:  [phone]  ?  ? ?

## 2021-07-04 NOTE — Telephone Encounter (Signed)
I attempted to talk with the pt, who speaks [place]. I tried to explain, bcs of the type of medication she is requesting we are only allowed to send 10 tabs per month. I explained that medicine was sent on [facility] to [facility] [address] requested by pt. She agrees to call pharmacy for refill. ?

## 2021-07-04 NOTE — Telephone Encounter (Signed)
Patient is calling in regards to prescription not being available at [facility]. ?

## 2021-07-11 LAB — COLOGUARD

## 2021-07-31 ENCOUNTER — Encounter: Payer: Self-pay | Admitting: Family Medicine

## 2021-07-31 ENCOUNTER — Ambulatory Visit: Payer: BC Managed Care – PPO | Admitting: Family Medicine

## 2021-08-14 ENCOUNTER — Ambulatory Visit: Payer: BC Managed Care – PPO | Admitting: Family Medicine

## 2021-08-14 ENCOUNTER — Encounter: Payer: Self-pay | Admitting: Family Medicine

## 2021-08-14 VITALS — BP 130/76 | HR 82 | Ht 63.0 in | Wt 221.6 lb

## 2021-08-14 DIAGNOSIS — Z1211 Encounter for screening for malignant neoplasm of colon: Secondary | ICD-10-CM

## 2021-08-14 DIAGNOSIS — Z800 Family history of malignant neoplasm of digestive organs: Secondary | ICD-10-CM | POA: Diagnosis not present

## 2021-08-14 DIAGNOSIS — I10 Essential (primary) hypertension: Secondary | ICD-10-CM | POA: Diagnosis not present

## 2021-08-14 DIAGNOSIS — F430 Acute stress reaction: Secondary | ICD-10-CM

## 2021-08-14 DIAGNOSIS — G43009 Migraine without aura, not intractable, without status migrainosus: Secondary | ICD-10-CM

## 2021-08-14 DIAGNOSIS — Z9189 Other specified personal risk factors, not elsewhere classified: Secondary | ICD-10-CM

## 2021-08-14 DIAGNOSIS — Z1212 Encounter for screening for malignant neoplasm of rectum: Secondary | ICD-10-CM

## 2021-08-14 DIAGNOSIS — F339 Major depressive disorder, recurrent, unspecified: Secondary | ICD-10-CM

## 2021-08-14 MED ORDER — BUTALBITAL-APAP-CAFFEINE 50-325-40 MG PO TABS: 1.0000 | ORAL_TABLET | Freq: Every day | ORAL | 0 refills | Status: DC | PRN

## 2021-08-14 MED ORDER — ALPRAZOLAM 0.5 MG PO TABS
0.5000 mg | ORAL_TABLET | Freq: Every day | ORAL | 0 refills | Status: DC | PRN
Start: 2021-08-14 — End: 2022-02-20

## 2021-08-14 NOTE — Patient Instructions (Addendum)
Please return in 6 weeks for recheck.  ? ?Please increase your venlafaxine to the full 100mg  tablet daily.  ? ?I have ordered a headache pill and a nerve reducing pill to be used as needed and sparingly.  ? ?We will call you to get you scheduled with gastroenterology to get you set up for colonoscopy. ? ?If you have any questions or concerns, please don't hesitate to send me a message via MyChart or call the office at [phone]. Thank you for visiting with [phone] today! It's our pleasure caring for you.  ?

## 2021-08-14 NOTE — Progress Notes (Signed)
? ?Subjective  ?CC:  ?Chief Complaint  ?Patient presents with  ? Headache  ?  Pt here to F/U with Hx. Pt would also like to discuss issues about her wt bc she gain a lot of wt last yr.  ? ? ?HPI: Lindsay Dennis is a 51 y.o. female who presents to the office today to address the problems listed above in the chief complaint. ?Hypertension f/u: changed from amlodipine to propranlol last visit and remains on altace. Tolerating change well.  ?Stress: unfortunately her sister was diagnosed with colon cancer. Pt is distraught. Crying, not sleeping well and having lots more tension headaches. ?Combination headaches and migraines: fortunately, the propranolol has lessened the migrainous headaches. However more occipital headaches. Requests pain meds. Used paracetamol in [place] or codeine.  ? ?Assessment  ?1. Migraine without aura and without status migrainosus, not intractable   ?2. Essential hypertension   ?3. Encounter for screening for colorectal cancer in high risk patient   ?4. Family history of colon cancer   ?5. Stress reaction   ?6. Major depression, recurrent, chronic ([place])   ? ?  ?Plan  ? ?Hypertension f/u: BP control is well controlled. Continue propranolol LA 120mg  daily. ?Migraines on prophylaxis: improving. Continue BB and imitrex as needed ?Combination headaches/stress headaches discussed need to improve mood and handle her stress. Fiorocet as needed if needed. Risks and benefits discussed.  ?Stress reaction on chronic depression: this had been well controlled but now worsened due to family stressor: increase zoloft back to 100mg  daily. Counseling done. ?Family history of colon cancer: refer to GI for colonoscopy. Pt now willing. Cancel cologuard.  ? ?I spent a total of 43 minutes for this patient encounter. Time spent included preparation, face-to-face counseling with the patient and coordination of care, review of chart and records, and documentation of the encounter. ? ?Education regarding management of  these chronic disease states was given. Management strategies discussed on successive visits include dietary and exercise recommendations, goals of achieving and maintaining IBW, and lifestyle modifications aiming for adequate sleep and minimizing stressors.  ? ?Follow up: No follow-ups on file. ? ?Orders Placed This Encounter  ?Procedures  ? Ambulatory referral to Gastroenterology  ? ?Meds ordered this encounter  ?Medications  ? ALPRAZolam (XANAX) 0.5 MG tablet  ?  Sig: Take 1 tablet (0.5 mg total) by mouth daily as needed for anxiety.  ?  Dispense:  30 tablet  ?  Refill:  0  ? butalbital-acetaminophen-caffeine (FIORICET) 50-325-40 MG tablet  ?  Sig: Take 1 tablet by mouth daily as needed for headache.  ?  Dispense:  20 tablet  ?  Refill:  0  ? ?  ? ?BP Readings from Last 3 Encounters:  ?08/14/21 130/76  ?07/02/21 113/69  ?04/17/21 124/76  ? ?Wt Readings from Last 3 Encounters:  ?08/14/21 221 lb 9.6 oz (100.5 kg)  ?07/02/21 219 lb 9.6 oz (99.6 kg)  ?04/17/21 210 lb 3.2 oz (95.3 kg)  ? ? ?Lab Results  ?Component Value Date  ? CHOL 247 (H) 07/02/2021  ? CHOL 221 (H) 04/08/2021  ? CHOL 212 (H) 10/09/2019  ? ?Lab Results  ?Component Value Date  ? HDL 58.70 07/02/2021  ? HDL 54.60 04/08/2021  ? HDL 46.90 10/09/2019  ? ?Lab Results  ?Component Value Date  ? LDLCALC 163 (H) 07/02/2021  ? LDLCALC 145 (H) 04/08/2021  ? LDLCALC 142 (H) 10/09/2019  ? ?Lab Results  ?Component Value Date  ? TRIG 127.0 07/02/2021  ? TRIG 106.0 04/08/2021  ?  TRIG 116.0 10/09/2019  ? ?Lab Results  ?Component Value Date  ? CHOLHDL 4 07/02/2021  ? CHOLHDL 4 04/08/2021  ? CHOLHDL 5 10/09/2019  ? ?No results found for: LDLDIRECT ?Lab Results  ?Component Value Date  ? CREATININE 0.74 07/02/2021  ? BUN 14 07/02/2021  ? NA 141 07/02/2021  ? K 4.7 07/02/2021  ? CL 107 07/02/2021  ? CO2 27 07/02/2021  ? ? ?The 10-year ASCVD risk score (Arnett DK, et al., 2019) is: 1.9% ?  Values used to calculate the score: ?    Age: 41 years ?    Sex: Female ?    Is  Non-Hispanic African American: No ?    Diabetic: No ?    Tobacco smoker: No ?    Systolic Blood Pressure: [id number] mmHg ?    Is BP treated: Yes ?    HDL Cholesterol: 58.7 mg/dL ?    Total Cholesterol: 247 mg/dL ? ?I reviewed the patients updated PMH, FH, and SocHx.  ?  ?Patient Active Problem List  ? Diagnosis Date Noted  ? Family history of colon cancer 08/14/2021  ? Low back pain 05/05/2021  ? Gastroesophageal reflux disease without esophagitis 04/17/2021  ? Hormone replacement therapy (HRT) 04/03/2021  ? Vitamin B12 deficiency 11/09/2019  ? Major depression, recurrent, chronic ([place]) 10/09/2019  ? Migraine headache without aura 10/09/2019  ? Essential hypertension 10/09/2019  ? Acquired hypothyroidism 10/09/2019  ? Gastric bypass status for obesity 10/09/2019  ? IUD (intrauterine device) in place 10/09/2019  ? ? ?Allergies: Levsin [hyoscyamine] ? ?Social History: ?Patient  reports that she has quit smoking. She has never used smokeless tobacco. She reports that she does not drink alcohol and does not use drugs. ? ?Current Meds  ?Medication Sig  ? ALPRAZolam (XANAX) 0.5 MG tablet Take 1 tablet (0.5 mg total) by mouth daily as needed for anxiety.  ? Bismuth Subsalicylate [id number] SUSP Take 15 mLs (525 mg total) by mouth every 4 (four) hours as needed (abdominal pain).  ? butalbital-acetaminophen-caffeine (FIORICET) 50-325-40 MG tablet Take 1 tablet by mouth daily as needed for headache.  ? levothyroxine (SYNTHROID) 50 MCG tablet Take 1 tablet (50 mcg total) by mouth daily.  ? meloxicam (MOBIC) 15 MG tablet Take 1 tablet (15 mg total) by mouth daily as needed for pain.  ? propranolol ER (INDERAL LA) 120 MG 24 hr capsule Take 1 capsule (120 mg total) by mouth daily.  ? ramipril (ALTACE) 10 MG capsule Take 1 capsule (10 mg total) by mouth daily.  ? SUMAtriptan (IMITREX) 50 MG tablet TAKE 1 TABLET BY MOUTH ONCE FOR 1 DOSE. AS NEEDED FOR MIGRAINE. MAY REPEAT DOSE IN 2 HOURS IF NEEDED  ? venlafaxine (EFFEXOR) 100 MG tablet  Take 100 mg by mouth daily.  ? ? ?Review of Systems: ?Cardiovascular: negative for chest pain, palpitations, leg swelling, orthopnea ?Respiratory: negative for SOB, wheezing or persistent cough ?Gastrointestinal: negative for abdominal pain ?Genitourinary: negative for dysuria or gross hematuria ? ?Objective  ?Vitals: BP 130/76   Pulse 82   Ht 5\' 3"  (1.6 m)   Wt 221 lb 9.6 oz (100.5 kg)   SpO2 96%   BMI 39.25 kg/m?  ? ?Psych:  Alert and oriented, anxious mood and affect, tearfl ?HEENT:  Normocephalic, atraumatic, supple neck  ?Cardiovascular:  RRR without murmur. no edema ?Respiratory:  Good breath sounds bilaterally, CTAB with normal respiratory effort ?Skin:  Warm, no rashes ? ?Commons side effects, risks, benefits, and alternatives for medications and treatment plan  prescribed today were discussed, and the patient expressed understanding of the given instructions. Patient is instructed to call or message via MyChart if he/she has any questions or concerns regarding our treatment plan. No barriers to understanding were identified. We discussed Red Flag symptoms and signs in detail. Patient expressed understanding regarding what to do in case of urgent or emergency type symptoms.  ?Medication list was reconciled, printed and provided to the patient in AVS. Patient instructions and summary information was reviewed with the patient as documented in the AVS. ?This note was prepared with assistance of Systems analyst. Occasional wrong-word or sound-a-like substitutions may have occurred due to the inherent limitations of voice recognition software ? ?This visit occurred during the SARS-CoV-2 public health emergency.  Safety protocols were in place, including screening questions prior to the visit, additional usage of staff PPE, and extensive cleaning of exam room while observing appropriate contact time as indicated for disinfecting solutions.  ?

## 2021-10-06 ENCOUNTER — Other Ambulatory Visit: Payer: Self-pay | Admitting: Family Medicine

## 2021-10-10 ENCOUNTER — Other Ambulatory Visit: Payer: Self-pay | Admitting: Family Medicine

## 2021-10-13 ENCOUNTER — Encounter: Payer: Self-pay | Admitting: Family Medicine

## 2021-10-13 ENCOUNTER — Ambulatory Visit: Payer: BC Managed Care – PPO | Admitting: Family Medicine

## 2021-10-13 VITALS — BP 120/80 | HR 79 | Temp 98.7°F | Ht 63.0 in | Wt 229.4 lb

## 2021-10-13 DIAGNOSIS — Z789 Other specified health status: Secondary | ICD-10-CM

## 2021-10-13 DIAGNOSIS — Z6372 Alcoholism and drug addiction in family: Secondary | ICD-10-CM

## 2021-10-13 DIAGNOSIS — F339 Major depressive disorder, recurrent, unspecified: Secondary | ICD-10-CM | POA: Diagnosis not present

## 2021-10-13 DIAGNOSIS — Z800 Family history of malignant neoplasm of digestive organs: Secondary | ICD-10-CM

## 2021-10-13 DIAGNOSIS — E039 Hypothyroidism, unspecified: Secondary | ICD-10-CM | POA: Diagnosis not present

## 2021-10-13 DIAGNOSIS — G43009 Migraine without aura, not intractable, without status migrainosus: Secondary | ICD-10-CM

## 2021-10-13 MED ORDER — LEVOTHYROXINE SODIUM 50 MCG PO TABS: 50.0000 ug | ORAL_TABLET | Freq: Every day | ORAL | 3 refills | Status: DC

## 2021-10-13 NOTE — Patient Instructions (Signed)
Please return after you return from [place]. Have a colonoscopy there if you can.   If you have any questions or concerns, please don't hesitate to send me a message via MyChart or call the office at [phone]. Thank you for visiting with [place] today! It's our pleasure caring for you.   Al-Anon support group for people who have family members with an addiction.  Supporting Someone With an Addiction Addiction is a condition that causes an uncontrollable (compulsive) need for a substance or behavior. A person can be addicted to alcohol, drugs, or prescription medicines, such as painkillers. An addiction can also be to a behavior, like gambling or shopping. Addiction can change the way a person's brain works. The need for the drug or activity can become so strong that the person thinks about it all the time or becomes physically dependent on it. When a person has an addiction, his or her condition can affect others, such as friends and family members. Friends and family can help by offering support and understanding. What do I need to know about addiction? Addiction can cause problems with mental and physical health. It can affect a person's ability to have healthy relationships and to meet responsibilities at home and at work or school. Signs of addiction may include: An intense craving for a drug or activity. Always thinking about the addiction. Planning life activities around the addiction. Being unable to stop using a drug or participating in a behavior. Devoting more time to the addiction than to other responsibilities, like school, work, or family. An increasing need for money. An addiction might lead a person to ask for unusual loans or steal items to sell. An exaggerated response to difficult situations, such as: Extreme anxiety or irritability. Aggression. Lying. Having trouble being realistic about the negative effects of a drug or activity. What do I need to know about the treatment  options? Treatment for addiction and recovery can be a long process. The person's treatment may involve: Stopping substance use safely. This may require taking medicines and being closely observed for several days. Group or individual counseling from mental health providers. The person may attend daily counseling sessions at a treatment center. Medicines to treat the addiction. Going to a support group. These groups are an important part of long-term recovery for many people. They include 12-step programs like Alcoholics Anonymous (AA), Narcotics Anonymous (NA), or Gamblers Anonymous (GA). Many people who undergo treatment start the addiction again after stopping. This is called a relapse. If the person with the addiction has a relapse, it does not mean that treatment will not work. Keep in mind that: This disorder involves the brain, the body, and the people in a person's life (social system). Changing unhealthy behaviors is a complex process that requires determination from the person with the addiction. The person may need to try several times to recover. Your support is important in helping the person recover. A responsible adult may need to stay with the person for some time after treatment. This adult can help the person with the addiction stay on track with recovery and can watch for symptoms that are getting worse. How can I support someone with an addiction? Talk about the addiction Be careful about too much prodding. Try not to overdo reminders to an adult about things like taking medicines. Ask how the person you care for prefers that you help. Discuss that it is not easy to quit because addiction can change the part of the brain that gives  someone self-control. Also, some people can easily become addicted because of their family genes. Ask the person you care for if he or she is open to giving you written permission to communicate with his or her providers if needed. Never ignore comments  about suicide, and do not try to avoid the subject of suicide. Talking about suicide will not make the person want to act on it. Ask the person you care for if you can go along to meet with her or his health care provider or therapist. You or the person with an addiction can reach out 24 hours a day to get free, private support (on the phone or a live online chat) from a suicide crisis helpline, such as the National Suicide Prevention Lifeline at 563-023-2718 or 988 in the [place] Find support and resources Work with a health care provider who specializes in addiction. Refer the person to trusted online resources that can provide information about addiction. A health care provider may be able to recommend resources. You could start with: Government sites such as the Substance Abuse and Mental Health Services Administration (SAMHSA): SkateOasis.com.pt National mental health organizations such as the [employer] on Mental Illness (NAMI): www.nami.org Look into local support groups or 12-step programs for the person. Connect with people in peer and family support groups. People in these groups understand what you and the person you care for are going through. They can help you feel a sense of comfort and connect you with local resources to help you learn more. General support Make an effort to learn all you can about substance use disorder. Help the person you care for follow his or her treatment plan as directed by health care providers. This could mean driving the person to therapy sessions or support group meetings. Tell the person that you will keep giving support and help her or him follow the treatment plan. Assure the person that even though treatment may be hard, it can work and that his or her substance use disorder can be managed. Encourage the person to avoid things, people, and situations (triggers) that may cause a relapse. Talk with the person's treatment center staff or health care  provider about how you can keep supporting the person you care for during treatment, recovery, and relapses if necessary. Follow these instructions at home: Safety Talk with the health care provider about ways the person you care for can stay safe. This may include: Vaccinations. Medicines to prevent death from overdose. Referrals for a clean needle exchange program. Sexual health counseling. If you believe that the person you care for is driving while using drugs or alcohol, it is important to confront him or her about the dangers of driving while drunk or high. In some cases, you may need to call the police to prevent harm to the person or others. Lifestyle Find ways to care for your body, mind, and well-being while supporting someone with an addiction, such as: Eat a healthy diet, exercise regularly, and get plenty of sleep. Join a support group for family members of people with addiction. Seek individual therapy to help you learn to cope with the person's disorder. Try to maintain your normal routines. Make time for activities that help you relax, and try to not feel guilty about taking time for yourself. What are some signs that the addiction is getting worse? Continuing to use more and more of a drug or do more and more of an activity over time. Continuing the drug or activity  even after using it has had negative consequences such as health problems or job loss. Having physical symptoms when trying to quit (withdrawal). Symptoms depend on the drug or substance, but general symptoms may include: Nausea or vomiting. Restlessness and irritability. Sweating. A feeling in you that you are powerless to help the person you care for get better. Get help right away if: The person you care for is showing signs that she or he is thinking about hurting herself or himself or someone else. Get help right away if you feel like the person you care for may hurt himself or herself or others, or if he  or she has thoughts about taking his or her own life. Go to your nearest emergency room or: Call 911. Call the National Suicide Prevention Lifeline at 628-487-6589 or 988 in the [place] This is open 24 hours a day. Text the Crisis Text Line at (807) 180-8649. Summary Addiction is a condition that causes a compulsive need for a substance or behavior. Treatment for addiction may include group or individual counseling and support groups. Many people who undergo treatment start the addiction again after stopping. If the person you care for has a relapse, it does not mean that treatment will not work. Find ways to care for your body, mind, and well-being while supporting someone with an addiction. Get help right away if you feel like the person you care for may hurt himself or herself or others, or if he or she has thoughts about taking his or her own life. This information is not intended to replace advice given to you by your health care provider. Make sure you discuss any questions you have with your health care provider. Document Revised: 11/07/2020 Document Reviewed: 10/16/2020 Elsevier Patient Education  2023 [employer].

## 2021-10-13 NOTE — Progress Notes (Signed)
Subjective  CC: f/u  HPI: Lindsay Dennis is a 51 y.o. female who presents to the office today to address the problems listed above in the chief complaint. Hypothyroidism: Stable on recent check.  Due refill, levothyroxine daily. Depression and anxiety: Continues to have stressful life.  Sister who was diagnosed with colon cancer fortunately had surgery and is doing better.  Patient is planning to have colonoscopy in [place] in August if she is able to.  We increased her Effexor to 100 mg daily 6 weeks ago and she feels this may be helping a little bit.  She is not able to tolerate the alprazolam due to sleepiness.  However she admits that her life is more stressful because her husband is a chronic alcoholic.  Home life is less difficult.  She is seeing a psychotherapist.  She reports she is safe in the home. Migraines: Overall better.  When she is stressed she will get a migraine.  Imitrex or Fioricet is helpful.  She continues on propanolol for blood pressure and prevention. Assessment  1. Acquired hypothyroidism   2. Spouse of alcoholic   3. Quit consuming alcohol in remote past   4. Major depression, recurrent, chronic (HCC)   5. Migraine without aura and without status migrainosus, not intractable   6. Family history of colon cancer      Plan  Depression: Counseling done.:  Stressful home life.  Recommend continuing psychotherapy.  Offered support group with Al-Anon.  Patient to look into.  Continue Effexor 100 mg daily Migraines are stable on propranolol.  Rescue medications include Imitrex and Fioricet as needed.  Follow-up Recommend colonoscopy  Follow up: When she returns from [place] in 3 months. Visit date not found  No orders of the defined types were placed in this encounter.  Meds ordered this encounter  Medications   levothyroxine (SYNTHROID) 50 MCG tablet    Sig: Take 1 tablet (50 mcg total) by mouth daily.    Dispense:  90 tablet    Refill:  3      I  reviewed the patients updated PMH, FH, and SocHx.    Patient Active Problem List   Diagnosis Date Noted   Spouse of alcoholic 10/13/2021   Quit consuming alcohol in remote past 10/13/2021   Family history of colon cancer 08/14/2021   Low back pain 05/05/2021   Gastroesophageal reflux disease without esophagitis 04/17/2021   Hormone replacement therapy (HRT) 04/03/2021   Vitamin B12 deficiency 11/09/2019   Major depression, recurrent, chronic (HCC) 10/09/2019   Migraine headache without aura 10/09/2019   Essential hypertension 10/09/2019   Acquired hypothyroidism 10/09/2019   Gastric bypass status for obesity 10/09/2019   IUD (intrauterine device) in place 10/09/2019   Current Meds  Medication Sig   ALPRAZolam (XANAX) 0.5 MG tablet Take 1 tablet (0.5 mg total) by mouth daily as needed for anxiety.   Bismuth Subsalicylate 525 MG/15ML SUSP Take 15 mLs (525 mg total) by mouth every 4 (four) hours as needed (abdominal pain).   butalbital-acetaminophen-caffeine (FIORICET) 50-325-40 MG tablet Take 1 tablet by mouth daily as needed for headache.   meloxicam (MOBIC) 15 MG tablet TAKE 1 TABLET BY MOUTH EVERY DAY AS NEEDED FOR PAIN   propranolol ER (INDERAL LA) 120 MG 24 hr capsule Take 1 capsule (120 mg total) by mouth daily.   ramipril (ALTACE) 10 MG capsule Take 1 capsule (10 mg total) by mouth daily.   SUMAtriptan (IMITREX) 50 MG tablet TAKE 1 TABLET BY MOUTH  ONCE FOR 1 DOSE. AS NEEDED FOR MIGRAINE. MAY REPEAT DOSE IN 2 HOURS IF NEEDED   venlafaxine (EFFEXOR) 100 MG tablet Take 100 mg by mouth daily.   vitamin B-12 (CYANOCOBALAMIN) 1000 MCG tablet Take 1 tablet (1,000 mcg total) by mouth daily.   [DISCONTINUED] levothyroxine (SYNTHROID) 50 MCG tablet Take 1 tablet (50 mcg total) by mouth daily.    Allergies: Patient is allergic to levsin [hyoscyamine]. Family History: Patient family history includes Lung cancer in her maternal grandmother. Social History:  Patient  reports that she has  quit smoking. She has never used smokeless tobacco. She reports that she does not drink alcohol and does not use drugs.  Review of Systems: Constitutional: Negative for fever malaise or anorexia Cardiovascular: negative for chest pain Respiratory: negative for SOB or persistent cough Gastrointestinal: negative for abdominal pain  Objective  Vitals: BP 120/80   Pulse 79   Temp 98.7 F (37.1 C)   Ht 5\' 3"  (1.6 m)   Wt 229 lb 6.4 oz (104.1 kg)   SpO2 94%   BMI 40.64 kg/m  General: Tearful, A&Ox3 Psych: Anxious, flat affect, fair insight HEENT: PEERL, conjunctiva normal, neck is supple Cardiovascular:  RRR without murmur or gallop.  Respiratory:  Good breath sounds bilaterally, CTAB with normal respiratory effort Skin:  Warm, no rashes    Commons side effects, risks, benefits, and alternatives for medications and treatment plan prescribed today were discussed, and the patient expressed understanding of the given instructions. Patient is instructed to call or message via MyChart if he/she has any questions or concerns regarding our treatment plan. No barriers to understanding were identified. We discussed Red Flag symptoms and signs in detail. Patient expressed understanding regarding what to do in case of urgent or emergency type symptoms.  Medication list was reconciled, printed and provided to the patient in AVS. Patient instructions and summary information was reviewed with the patient as documented in the AVS. This note was prepared with assistance of Dragon voice recognition software. Occasional wrong-word or sound-a-like substitutions may have occurred due to the inherent limitations of voice recognition software  This visit occurred during the SARS-CoV-2 public health emergency.  Safety protocols were in place, including screening questions prior to the visit, additional usage of staff PPE, and extensive cleaning of exam room while observing appropriate contact time as indicated for  disinfecting solutions.

## 2021-10-22 ENCOUNTER — Ambulatory Visit: Payer: BC Managed Care – PPO | Admitting: Family Medicine

## 2021-10-24 ENCOUNTER — Encounter: Payer: Self-pay | Admitting: Family Medicine

## 2021-10-24 ENCOUNTER — Ambulatory Visit: Payer: BC Managed Care – PPO | Admitting: Family Medicine

## 2021-10-24 VITALS — BP 138/86 | HR 74 | Temp 98.7°F | Ht 63.0 in | Wt 224.0 lb

## 2021-10-24 DIAGNOSIS — N6321 Unspecified lump in the left breast, upper outer quadrant: Secondary | ICD-10-CM | POA: Diagnosis not present

## 2021-10-24 DIAGNOSIS — N643 Galactorrhea not associated with childbirth: Secondary | ICD-10-CM

## 2021-10-24 LAB — TSH: TSH: 1.8 u[IU]/mL (ref 0.35–5.50)

## 2021-10-24 NOTE — Progress Notes (Signed)
Subjective  CC:  Chief Complaint  Patient presents with   Breast Discharge    Pt stated that she has noticed some breast discharge out of both but the Lt has green discharge. It has been going on since 10/21/2021    HPI: Lindsay Dennis is a 51 y.o. female who presents to the office today to address the problems listed above in the chief complaint. 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  She noticed this while she was undressing after swimming at the pool.  Discharge is described as green and oily.  Discharge is painless.  Nonbloody.  She has not noted a persistent mass but does state that she typically has lumps in the breast.  She does not check regularly.  Her last mammogram was March 2022.  She was planning to wait to get her mammogram this year when she goes to [place] this summer.  She has no first-degree relatives with breast cancer.  She has no children.  She is not currently on hormones.  She is not currently sexually active.  She does have menstrual cycles.  Assessment  1. Galactorrhea of left breast   2. Mass of upper outer quadrant of left breast      Plan  Spontaneous onset nipple discharge from the left with associated possible breast mass: Urgent referral for mammogram and possible ultrasound.  Check prolactin levels and TSH.  Further management pending results.  Education and counseling given.  Follow up: 2 to 3 weeks for recheck. Visit date not found  Orders Placed This Encounter  Procedures   MM Digital Diagnostic Bilat   US BREAST LTD UNI RIGHT INC AXILLA   Prolactin   TSH   No orders of the defined types were placed in this encounter.     I reviewed the patients updated PMH, FH, and SocHx.    Patient Active Problem List   Diagnosis Date Noted   Spouse of alcoholic 10/13/2021   Quit consuming alcohol in remote past 10/13/2021    Family history of colon cancer 08/14/2021   Low back pain 05/05/2021   Gastroesophageal reflux disease without esophagitis 04/17/2021   Hormone replacement therapy (HRT) 04/03/2021   Vitamin B12 deficiency 11/09/2019   Major depression, recurrent, chronic (HCC) 10/09/2019   Migraine headache without aura 10/09/2019   Essential hypertension 10/09/2019   Acquired hypothyroidism 10/09/2019   Gastric bypass status for obesity 10/09/2019   IUD (intrauterine device) in place 10/09/2019   Current Meds  Medication Sig   ALPRAZolam (XANAX) 0.5 MG tablet Take 1 tablet (0.5 mg total) by mouth daily as needed for anxiety.   Bismuth Subsalicylate 525 MG/15ML SUSP Take 15 mLs (525 mg total) by mouth every 4 (four) hours as needed (abdominal pain).   butalbital-acetaminophen-caffeine (FIORICET) 50-325-40 MG tablet Take 1 tablet by mouth daily as needed for headache.   levothyroxine (SYNTHROID) 50 MCG tablet Take 1 tablet (50 mcg total) by mouth daily.   meloxicam (MOBIC) 15 MG tablet TAKE 1 TABLET BY MOUTH EVERY DAY AS NEEDED FOR PAIN   propranolol ER (INDERAL LA) 120 MG 24 hr capsule Take 1 capsule (120 mg total) by mouth daily.   ramipril (ALTACE) 10 MG capsule Take 1 capsule (10 mg total) by mouth daily.   SUMAtriptan (IMITREX) 50 MG tablet TAKE 1 TABLET BY MOUTH ONCE FOR 1 DOSE. AS NEEDED FOR MIGRAINE. MAY REPEAT DOSE IN  2 HOURS IF NEEDED   venlafaxine (EFFEXOR) 100 MG tablet Take 100 mg by mouth daily.   vitamin B-12 (CYANOCOBALAMIN) 1000 MCG tablet Take 1 tablet (1,000 mcg total) by mouth daily.    Allergies: Patient is allergic to levsin [hyoscyamine]. Family History: Patient family history includes Lung cancer in her maternal grandmother. Social History:  Patient  reports that she has quit smoking. She has never used smokeless tobacco. She reports that she does not drink alcohol and does not use drugs.  Review of Systems: Constitutional: Negative for fever malaise or  anorexia Cardiovascular: negative for chest pain Respiratory: negative for SOB or persistent cough Gastrointestinal: negative for abdominal pain  Objective  Vitals: BP 138/86   Pulse 74   Temp 98.7 F (37.1 C)   Ht 5\' 3"  (1.6 m)   Wt 224 lb (101.6 kg)   SpO2 98%   BMI 39.68 kg/m  General: no acute distress , A&Ox3 Breast: Right breast exam is normal without nipple discharge or masses.  No lymphadenopathy.  Left breast with mobile nontender 0.5 to 1 cm mass left upper quadrant.  A greenish discharge can be expressed from 1 area of the nipple.  No lymphadenopathy present.  A few other smaller nodules are present.  Commons side effects, risks, benefits, and alternatives for medications and treatment plan prescribed today were discussed, and the patient expressed understanding of the given instructions. Patient is instructed to call or message via MyChart if he/she has any questions or concerns regarding our treatment plan. No barriers to understanding were identified. We discussed Red Flag symptoms and signs in detail. Patient expressed understanding regarding what to do in case of urgent or emergency type symptoms.  Medication list was reconciled, printed and provided to the patient in AVS. Patient instructions and summary information was reviewed with the patient as documented in the AVS. This note was prepared with assistance of Dragon voice recognition software. Occasional wrong-word or sound-a-like substitutions may have occurred due to the inherent limitations of voice recognition software  This visit occurred during the SARS-CoV-2 public health emergency.  Safety protocols were in place, including screening questions prior to the visit, additional usage of staff PPE, and extensive cleaning of exam room while observing appropriate contact time as indicated for disinfecting solutions.

## 2021-10-24 NOTE — Patient Instructions (Addendum)
We will call you to give you an appointment for the mammogram and ultrasound.   Galactorrhea Galactorrhea is an abnormal milky discharge from the breast. The discharge may come from one or both nipples. It is different from the normal milk produced in nursing mothers. Galactorrhea usually occurs in women, but it can sometimes affect men. Galactorrhea can be a sign of something more serious, such as diseases of the kidney or thyroid, or problems with the pituitary gland. Your health care provider may do various tests to help determine the cause. What are the causes? This condition may be caused by: Irritation of the breast, which can result from injury, stimulation during sexual activity, or clothes rubbing against the nipple. Certain prescription medicines. Birth control pills. Certain herbal supplements. Changes in hormone levels. Stress. Sometimes the cause is not known. What are the signs or symptoms? The main symptom of this condition is nipple discharge. The discharge is often white, yellow, or green, and can be seen in one or both breasts. The discharge may flow without stopping, or it may stop and start again. How is this diagnosed? This condition may be diagnosed based on: Collecting and testing the discharge. Blood tests. Imaging tests. Tests to rule out other conditions. How is this treated? In many cases, galactorrhea will go away without treatment. In this case, your health care provider will monitor your condition to make sure that it goes away. When treatment is required, your health care provider will treat the underlying cause, such as irritation of the nipples or changes in hormone levels. Certain medicines may be stopped, if they are causing your symptoms. Follow these instructions at home:  Breast care Watch your condition for any changes. Do not squeeze your breasts or nipples. Avoid breast stimulation during sexual activity. Perform a breast self-exam once a month.  Doing this more often can irritate your breasts. Avoid clothes that rub on your nipples. Use breast pads to absorb the discharge. Wear a breast binder or a support bra to help prevent clothes from rubbing on your nipples. General instructions Take over-the-counter and prescription medicines only as told by your health care provider. Keep all follow-up visits. This is important. Contact a health care provider if: You develop hot flashes, vaginal dryness, or a lack of sexual desire. You stop having menstrual periods, or they become irregular or far apart. You have headaches. You have vision problems. Get help right away if: You have breast discharge that is bloody or pus-like. You have breast pain. You feel a lump in your breast. You have wrinkling or dimpling on your breast. You notice that your breast becomes red and swollen. Summary Galactorrhea is an abnormal milky discharge from the breast. The fluid may come from one or both nipples and is often white, yellow, or green. Galactorrhea may be caused by various things, such as irritation of the nipples, medicines, or changes in hormone levels. Galactorrhea often goes away without treatment. However, it also may be a sign of something more serious, such as diseases of the kidney or thyroid, or problems with the pituitary gland. Get help right away if you have discharge that is bloody or pus-like, if you have breast pain or a lump, or if you have skin changes on your breast. This information is not intended to replace advice given to you by your health care provider. Make sure you discuss any questions you have with your health care provider. Document Revised: 02/12/2020 Document Reviewed: 02/12/2020 Elsevier Patient Education  2023  [employer].

## 2021-10-25 LAB — PROLACTIN: Prolactin: 9.3 ng/mL

## 2021-10-27 ENCOUNTER — Ambulatory Visit: Payer: BC Managed Care – PPO | Admitting: Family Medicine

## 2021-10-31 ENCOUNTER — Ambulatory Visit: Payer: BC Managed Care – PPO

## 2021-10-31 ENCOUNTER — Other Ambulatory Visit: Payer: Self-pay | Admitting: Family Medicine

## 2021-10-31 ENCOUNTER — Ambulatory Visit
Admission: RE | Admit: 2021-10-31 | Discharge: 2021-10-31 | Disposition: A | Discharge status: Home / Self Care | Payer: BC Managed Care – PPO | Source: Ambulatory Visit | Attending: Family Medicine | Admitting: Family Medicine

## 2021-10-31 DIAGNOSIS — N6321 Unspecified lump in the left breast, upper outer quadrant: Secondary | ICD-10-CM

## 2021-10-31 DIAGNOSIS — N643 Galactorrhea not associated with childbirth: Secondary | ICD-10-CM

## 2021-11-03 ENCOUNTER — Other Ambulatory Visit: Payer: Self-pay | Admitting: Family Medicine

## 2021-11-03 DIAGNOSIS — N6321 Unspecified lump in the left breast, upper outer quadrant: Secondary | ICD-10-CM

## 2021-11-03 DIAGNOSIS — N643 Galactorrhea not associated with childbirth: Secondary | ICD-10-CM

## 2021-11-12 ENCOUNTER — Ambulatory Visit
Admission: RE | Admit: 2021-11-12 | Discharge: 2021-11-12 | Disposition: A | Discharge status: Home / Self Care | Payer: BC Managed Care – PPO | Source: Ambulatory Visit | Attending: Family Medicine | Admitting: Family Medicine

## 2021-11-12 DIAGNOSIS — N643 Galactorrhea not associated with childbirth: Secondary | ICD-10-CM

## 2021-11-12 DIAGNOSIS — N6321 Unspecified lump in the left breast, upper outer quadrant: Secondary | ICD-10-CM

## 2021-11-13 ENCOUNTER — Encounter: Payer: Self-pay | Admitting: Family Medicine

## 2021-11-13 DIAGNOSIS — N643 Galactorrhea not associated with childbirth: Secondary | ICD-10-CM | POA: Insufficient documentation

## 2021-11-26 ENCOUNTER — Other Ambulatory Visit: Payer: Self-pay | Admitting: Family Medicine

## 2021-11-26 MED ORDER — BUTALBITAL-APAP-CAFFEINE 50-325-40 MG PO TABS: 1.0000 | ORAL_TABLET | Freq: Every day | ORAL | 0 refills | Status: DC | PRN

## 2021-11-26 NOTE — Telephone Encounter (Signed)
LAST APPOINTMENT DATE: 10/24/2021   NEXT APPOINTMENT DATE: Visit date not found    LAST REFILL: 08/14/2021  QTY:20

## 2021-12-17 ENCOUNTER — Other Ambulatory Visit: Payer: Self-pay | Admitting: Family Medicine

## 2022-01-19 ENCOUNTER — Encounter: Payer: Self-pay | Admitting: *Deleted

## 2022-02-13 ENCOUNTER — Ambulatory Visit: Payer: BC Managed Care – PPO | Admitting: Family Medicine

## 2022-02-13 ENCOUNTER — Encounter: Payer: Self-pay | Admitting: Family Medicine

## 2022-02-13 ENCOUNTER — Other Ambulatory Visit: Payer: Self-pay | Admitting: Family Medicine

## 2022-02-13 VITALS — BP 124/82 | HR 75 | Temp 97.9°F | Ht 63.0 in | Wt 202.4 lb

## 2022-02-13 DIAGNOSIS — F339 Major depressive disorder, recurrent, unspecified: Secondary | ICD-10-CM | POA: Diagnosis not present

## 2022-02-13 DIAGNOSIS — M545 Low back pain, unspecified: Secondary | ICD-10-CM | POA: Diagnosis not present

## 2022-02-13 DIAGNOSIS — E039 Hypothyroidism, unspecified: Secondary | ICD-10-CM

## 2022-02-13 DIAGNOSIS — I10 Essential (primary) hypertension: Secondary | ICD-10-CM | POA: Diagnosis not present

## 2022-02-13 DIAGNOSIS — G8929 Other chronic pain: Secondary | ICD-10-CM

## 2022-02-13 MED ORDER — OZEMPIC (1 MG/DOSE) 2 MG/1.5ML ~~LOC~~ SOPN: 1.0000 mg | PEN_INJECTOR | SUBCUTANEOUS | 5 refills | Status: DC

## 2022-02-13 MED ORDER — LEVOTHYROXINE SODIUM 50 MCG PO TABS: 50.0000 ug | ORAL_TABLET | Freq: Every day | ORAL | 3 refills | Status: DC

## 2022-02-13 NOTE — Patient Instructions (Signed)
Please return in 6 weeks; please use 30 minute appointment slots only due to language interpreter needs. Thanks.   Please schedule an appointment with your orthopedist.   If you have any questions or concerns, please don't hesitate to send me a message via MyChart or call the office at [phone]. Thank you for visiting with [place] today! It's our pleasure caring for you.

## 2022-02-13 NOTE — Progress Notes (Signed)
Subjective  CC: follow up  HPI: Lindsay Dennis is a 51 y.o. female who presents to the office today to address the problems listed above in the chief complaint. 51 yo returned from months in [place]. While there, right knee pain was managed ... Needs to see ortho. Started weight management with ozempic and requesting refill. Needs thyroid meds refilled. And mood medications were changed. She reports mood is ok. Glad to be home. No sxs of high or low thyroid. She also reported she was taking too many opioids and has since stopped. On lyrica for back pain and needs refill. Off effexor and on prozac now.    Lab Results  Component Value Date   TSH 1.80 10/24/2021    Assessment  1. Essential hypertension   2. Acquired hypothyroidism   3. Major depression, recurrent, chronic ([place])   4. Chronic bilateral low back pain without sciatica      Plan  HTN:  controlled on altace 10 Refilled thyroid meds, controlled Depression: will monitor on prozac.  Knee pain:pt to see her ortho here in town:murphy wainer Back pain: lyrica refilled.  Follow up: 6 mo for cpe  07/08/2022  No orders of the defined types were placed in this encounter.  Meds ordered this encounter  Medications   levothyroxine (SYNTHROID) 50 MCG tablet    Sig: Take 1 tablet (50 mcg total) by mouth daily.    Dispense:  90 tablet    Refill:  3   Semaglutide, 1 MG/DOSE, (OZEMPIC, 1 MG/DOSE,) 2 MG/1.5ML SOPN    Sig: Inject 1 mg into the skin once a week.    Dispense:  2 mL    Refill:  5    For weight loss: if not covered, is wegovy?? Thanks.      I reviewed the patients updated PMH, FH, and SocHx.    Patient Active Problem List   Diagnosis Date Noted   Galactorrhea on left side 11/13/2021   Spouse of alcoholic [id number]   Quit consuming alcohol in remote past 10/13/2021   Family history of colon cancer 08/14/2021   Low back pain 05/05/2021   Gastroesophageal reflux disease without esophagitis 04/17/2021   Hormone  replacement therapy (HRT) 04/03/2021   Vitamin B12 deficiency 11/09/2019   Major depression, recurrent, chronic (HCC) 10/09/2019   Migraine headache without aura 10/09/2019   Essential hypertension 10/09/2019   Acquired hypothyroidism 10/09/2019   Gastric bypass status for obesity 10/09/2019   IUD (intrauterine device) in place 10/09/2019   Current Meds  Medication Sig   ALPRAZolam (XANAX) 0.5 MG tablet Take 1 tablet (0.5 mg total) by mouth daily as needed for anxiety.   Bismuth Subsalicylate [id number] SUSP Take 15 mLs (525 mg total) by mouth every 4 (four) hours as needed (abdominal pain).   butalbital-acetaminophen-caffeine (FIORICET) 50-325-40 MG tablet Take 1 tablet by mouth daily as needed for headache.   FLUoxetine (PROZAC) 20 MG capsule Take 1 capsule (20 mg total) by mouth daily.   pregabalin (LYRICA) 200 MG capsule Take 1 capsule (200 mg total) by mouth 2 (two) times daily.   propranolol ER (INDERAL LA) 120 MG 24 hr capsule Take 1 capsule (120 mg total) by mouth daily.   ramipril (ALTACE) 10 MG capsule Take 1 capsule (10 mg total) by mouth daily.   SUMAtriptan (IMITREX) 50 MG tablet TAKE 1 TABLET BY MOUTH ONCE FOR 1 DOSE. AS NEEDED FOR MIGRAINE. MAY REPEAT DOSE IN 2 HOURS IF NEEDED   [DISCONTINUED] levothyroxine (SYNTHROID) 50 MCG  tablet Take 1 tablet (50 mcg total) by mouth daily.   [DISCONTINUED] Semaglutide, 1 MG/DOSE, (OZEMPIC, 1 MG/DOSE,) 2 MG/1.5ML SOPN Inject into the skin.    Allergies: Patient is allergic to levsin [hyoscyamine]. Family History: Patient family history includes Colon cancer in her sister; Lung cancer in her maternal grandmother. Social History:  Patient  reports that she has quit smoking. She has never used smokeless tobacco. She reports that she does not drink alcohol and does not use drugs.  Review of Systems: Constitutional: Negative for fever malaise or anorexia Cardiovascular: negative for chest pain Respiratory: negative for SOB or persistent  cough Gastrointestinal: negative for abdominal pain  Objective  Vitals: BP 124/82   Pulse 75   Temp 97.9 F (36.6 C)   Ht 5\' 3"  (1.6 m)   Wt 202 lb 6.4 oz (91.8 kg)   SpO2 95%   BMI 35.85 kg/m  General: no acute distress , A&Ox3 HEENT: PEERL, conjunctiva normal, neck is supple Cardiovascular:  RRR without murmur or gallop.  Respiratory:  Good breath sounds bilaterally, CTAB with normal respiratory effort Skin:  Warm, no rashes    Commons side effects, risks, benefits, and alternatives for medications and treatment plan prescribed today were discussed, and the patient expressed understanding of the given instructions. Patient is instructed to call or message via MyChart if he/she has any questions or concerns regarding our treatment plan. No barriers to understanding were identified. We discussed Red Flag symptoms and signs in detail. Patient expressed understanding regarding what to do in case of urgent or emergency type symptoms.  Medication list was reconciled, printed and provided to the patient in AVS. Patient instructions and summary information was reviewed with the patient as documented in the AVS. This note was prepared with assistance of Dragon voice recognition software. Occasional wrong-word or sound-a-like substitutions may have occurred due to the inherent limitations of voice recognition software  This visit occurred during the SARS-CoV-2 public health emergency.  Safety protocols were in place, including screening questions prior to the visit, additional usage of staff PPE, and extensive cleaning of exam room while observing appropriate contact time as indicated for disinfecting solutions.

## 2022-02-20 ENCOUNTER — Ambulatory Visit: Payer: BC Managed Care – PPO | Admitting: Family Medicine

## 2022-02-20 VITALS — BP 110/74 | HR 75 | Temp 97.8°F | Ht 63.0 in | Wt 197.6 lb

## 2022-02-20 DIAGNOSIS — F339 Major depressive disorder, recurrent, unspecified: Secondary | ICD-10-CM | POA: Diagnosis not present

## 2022-02-20 DIAGNOSIS — F1111 Opioid abuse, in remission: Secondary | ICD-10-CM

## 2022-02-20 MED ORDER — VENLAFAXINE HCL 75 MG PO TABS
ORAL_TABLET | ORAL | 0 refills | Status: DC
Start: 2022-02-20 — End: 2022-05-18

## 2022-02-20 NOTE — Progress Notes (Unsigned)
Subjective  CC: No chief complaint on file.   HPI: Lindsay Dennis is a 51 y.o. female who presents to the office today to address the problems listed above in the chief complaint. ***  Assessment  No diagnosis found.   Plan  ***:  ***  Follow up: No follow-ups on file.  07/08/2022  No orders of the defined types were placed in this encounter.  No orders of the defined types were placed in this encounter.     I reviewed the patients updated PMH, FH, and SocHx.    Patient Active Problem List   Diagnosis Date Noted   Galactorrhea on left side 11/13/2021   Spouse of alcoholic 10/13/2021   Quit consuming alcohol in remote past 10/13/2021   Family history of colon cancer 08/14/2021   Low back pain 05/05/2021   Gastroesophageal reflux disease without esophagitis 04/17/2021   Hormone replacement therapy (HRT) 04/03/2021   Vitamin B12 deficiency 11/09/2019   Major depression, recurrent, chronic (HCC) 10/09/2019   Migraine headache without aura 10/09/2019   Essential hypertension 10/09/2019   Acquired hypothyroidism 10/09/2019   Gastric bypass status for obesity 10/09/2019   IUD (intrauterine device) in place 10/09/2019   Current Meds  Medication Sig   butalbital-acetaminophen-caffeine (FIORICET) 50-325-40 MG tablet Take 1 tablet by mouth daily as needed for headache.   levothyroxine (SYNTHROID) 50 MCG tablet Take 1 tablet (50 mcg total) by mouth daily.   pregabalin (LYRICA) 200 MG capsule Take 1 capsule (200 mg total) by mouth 2 (two) times daily.   propranolol ER (INDERAL LA) 120 MG 24 hr capsule Take 1 capsule (120 mg total) by mouth daily.   ramipril (ALTACE) 10 MG capsule Take 1 capsule (10 mg total) by mouth daily.   Semaglutide, 1 MG/DOSE, (OZEMPIC, 1 MG/DOSE,) 4 MG/3ML SOPN Inject 1 mg into the skin once a week.   SUMAtriptan (IMITREX) 50 MG tablet TAKE 1 TABLET BY MOUTH ONCE FOR 1 DOSE. AS NEEDED FOR MIGRAINE. MAY REPEAT DOSE IN 2 HOURS IF NEEDED     Allergies: Patient is allergic to levsin [hyoscyamine]. Family History: Patient family history includes Colon cancer in her sister; Lung cancer in her maternal grandmother. Social History:  Patient  reports that she has quit smoking. She has never used smokeless tobacco. She reports that she does not drink alcohol and does not use drugs.  Review of Systems: Constitutional: Negative for fever malaise or anorexia Cardiovascular: negative for chest pain Respiratory: negative for SOB or persistent cough Gastrointestinal: negative for abdominal pain  Objective  Vitals: BP 110/74   Pulse 75   Temp 97.8 F (36.6 C)   Ht 5\' 3"  (1.6 m)   Wt 197 lb 9.6 oz (89.6 kg)   SpO2 96%   BMI 35.00 kg/m  General: no acute distress , A&Ox3 HEENT: PEERL, conjunctiva normal, neck is supple Cardiovascular:  RRR without murmur or gallop.  Respiratory:  Good breath sounds bilaterally, CTAB with normal respiratory effort Skin:  Warm, no rashes    Commons side effects, risks, benefits, and alternatives for medications and treatment plan prescribed today were discussed, and the patient expressed understanding of the given instructions. Patient is instructed to call or message via MyChart if he/she has any questions or concerns regarding our treatment plan. No barriers to understanding were identified. We discussed Red Flag symptoms and signs in detail. Patient expressed understanding regarding what to do in case of urgent or emergency type symptoms.  Medication list was reconciled, printed and provided  to the patient in AVS. Patient instructions and summary information was reviewed with the patient as documented in the AVS. This note was prepared with assistance of Dragon voice recognition software. Occasional wrong-word or sound-a-like substitutions may have occurred due to the inherent limitations of voice recognition software  This visit occurred during the SARS-CoV-2 public health emergency.  Safety  protocols were in place, including screening questions prior to the visit, additional usage of staff PPE, and extensive cleaning of exam room while observing appropriate contact time as indicated for disinfecting solutions.

## 2022-02-20 NOTE — Patient Instructions (Signed)
Please return in 6 weeks to recheck mood.   If you have any questions or concerns, please don't hesitate to send me a message via MyChart or call the office at [phone]. Thank you for visiting with us today! It's our pleasure caring for you.  

## 2022-02-23 ENCOUNTER — Ambulatory Visit: Payer: BC Managed Care – PPO | Admitting: Family Medicine

## 2022-03-08 IMAGING — CT CT ABD-PELV W/ CM
2 of 5 series · 16 of 46 positions shown, 18 images · IV contrast (omnipaque)
Comparison: None available.

CLINICAL DATA: Initial evaluation for acute upper abdominal pain.
History of prior gastric sleeve surgery.

EXAM:
CT ABDOMEN AND PELVIS WITH CONTRAST
TECHNIQUE: Multidetector CT imaging of the abdomen and pelvis was performed
using the standard protocol following bolus administration of
intravenous contrast.
CONTRAST:  100mL OMNIPAQUE IOHEXOL 300 MG/ML  SOLN

[Series 3: abdomen 5.0 · axial · 0.98mm/px · z∈[+571,+1021]mm · 13 of 104 slices shown, 15 images]
[im 7/104  soft-tissue]
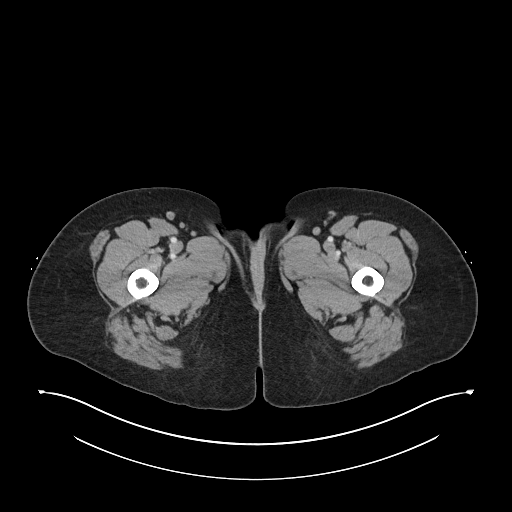
[im 7/104  bone]
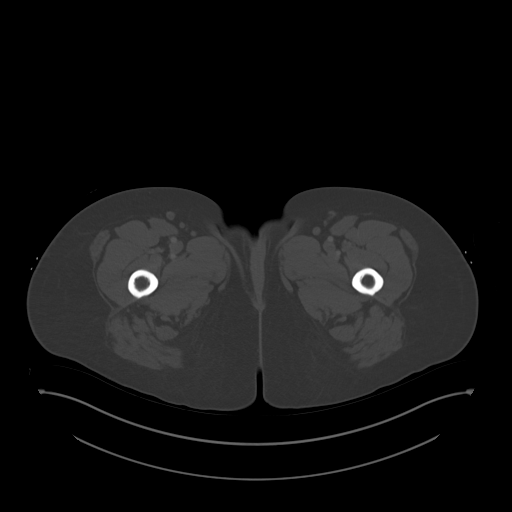
[im 14/104  soft-tissue]
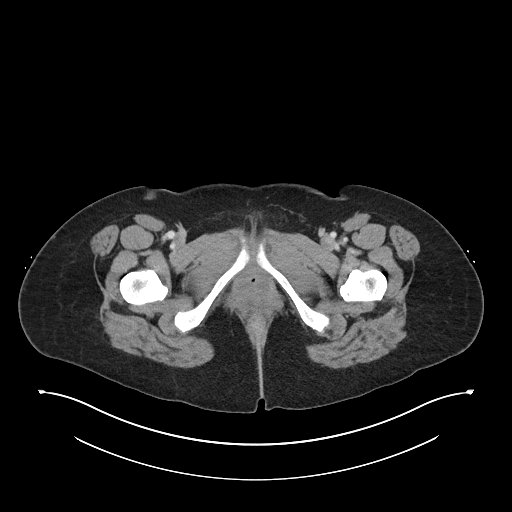
[im 21/104  soft-tissue]
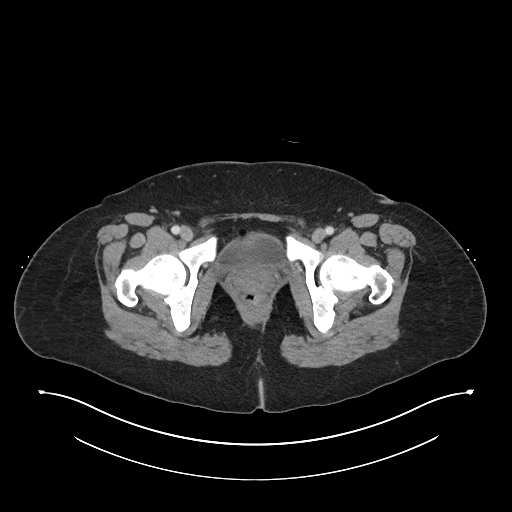
[im 28/104  soft-tissue]
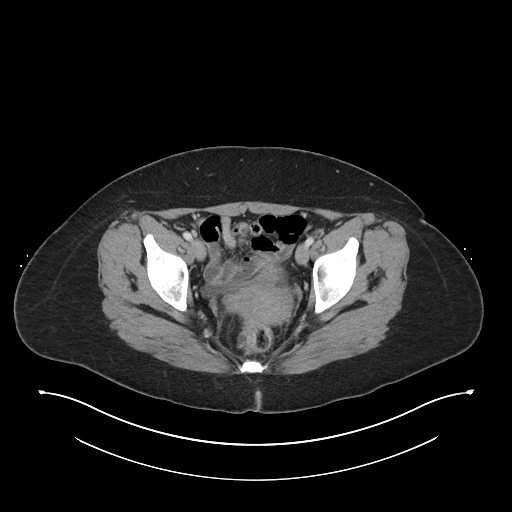
[im 35/104  soft-tissue]
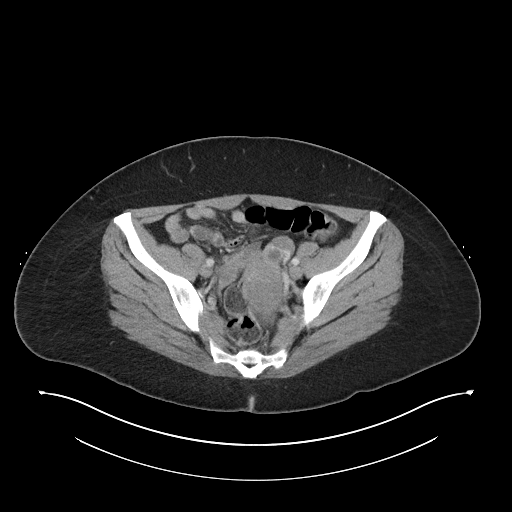
[im 42/104  soft-tissue]
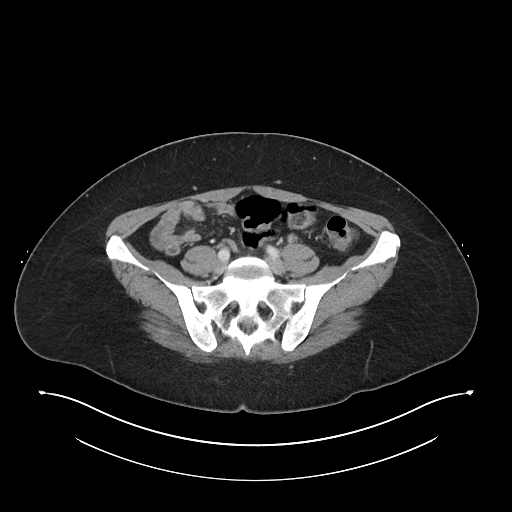
[im 55/104  soft-tissue]
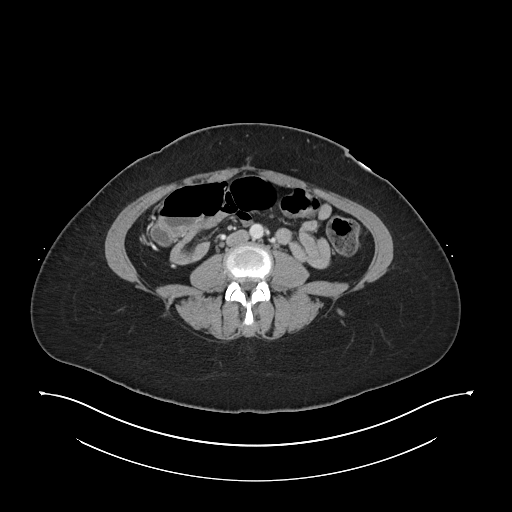
[im 62/104  soft-tissue]
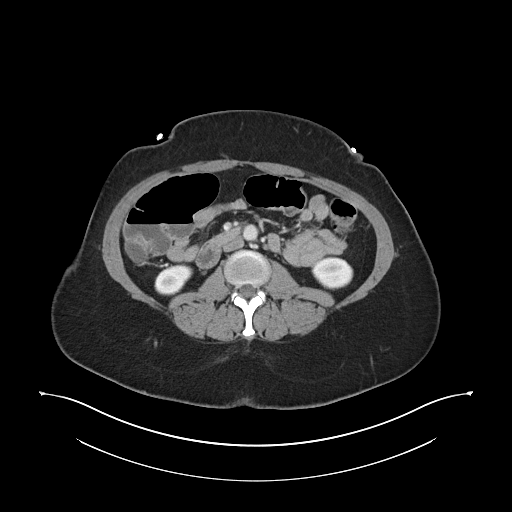
[im 69/104  soft-tissue]
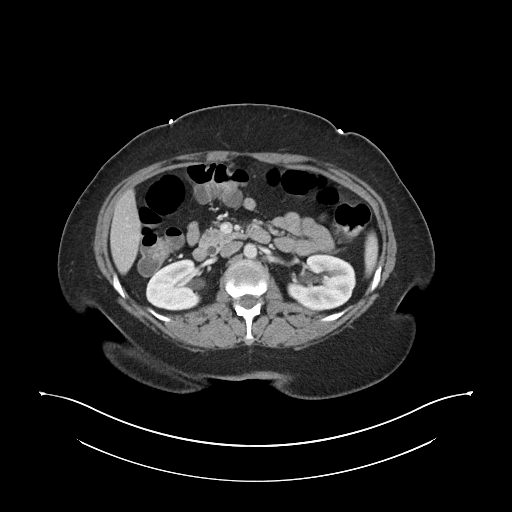
[im 69/104  bone]
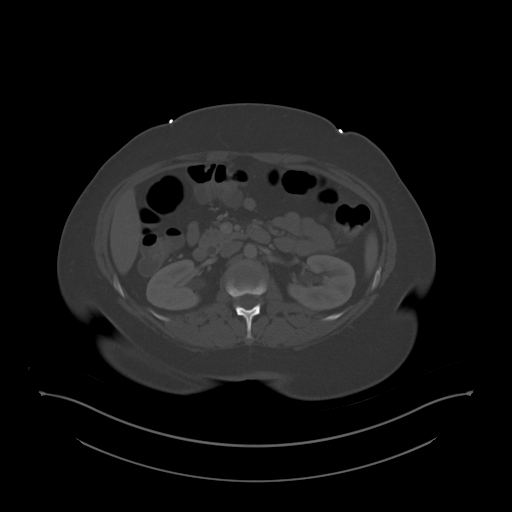
[im 76/104  soft-tissue]
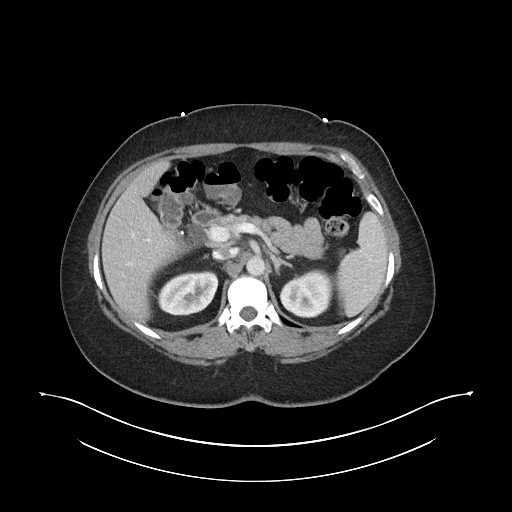
[im 83/104  soft-tissue]
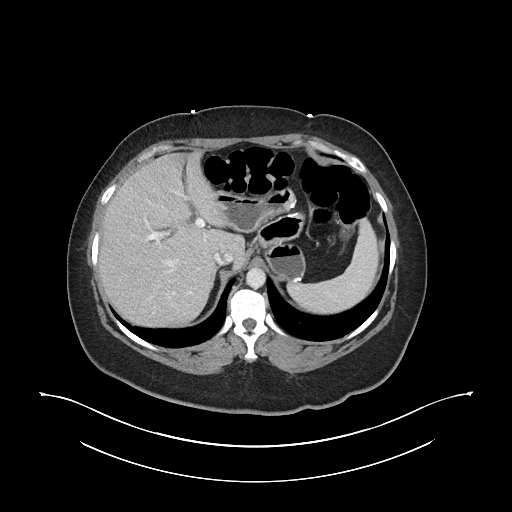
[im 90/104  soft-tissue]
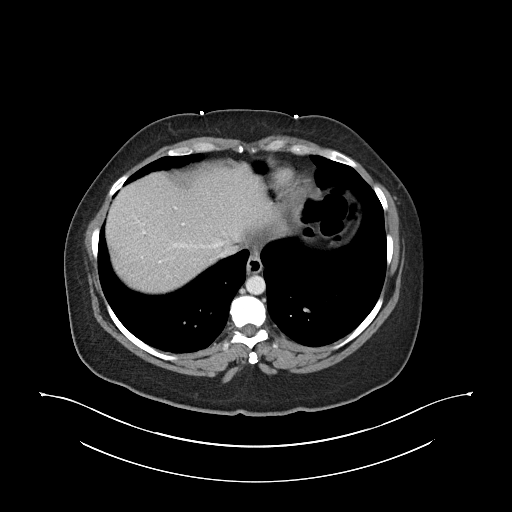
[im 97/104  soft-tissue]
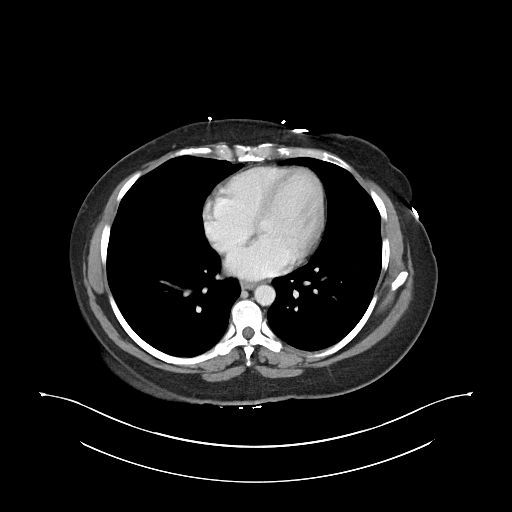

[Series 6: abdomen 3.0 mpr cor · coronal · 0.85mm/px · 3 of 103 slices shown]
[im 35/103  soft-tissue]
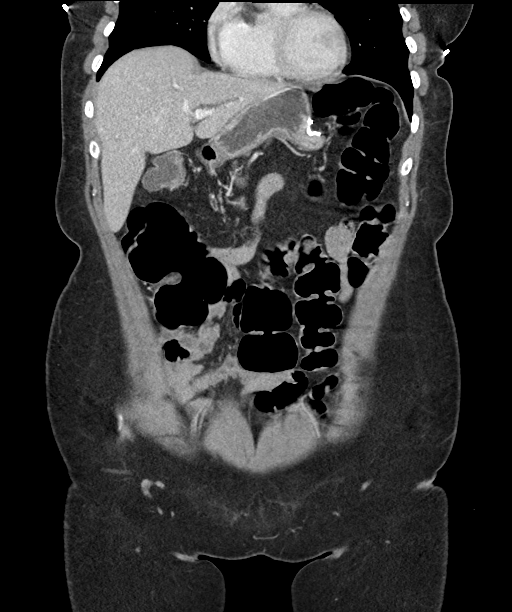
[im 46/103  soft-tissue]
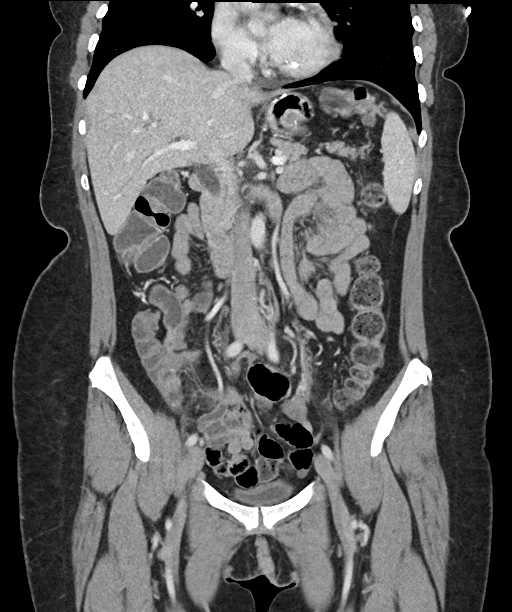
[im 57/103  soft-tissue]
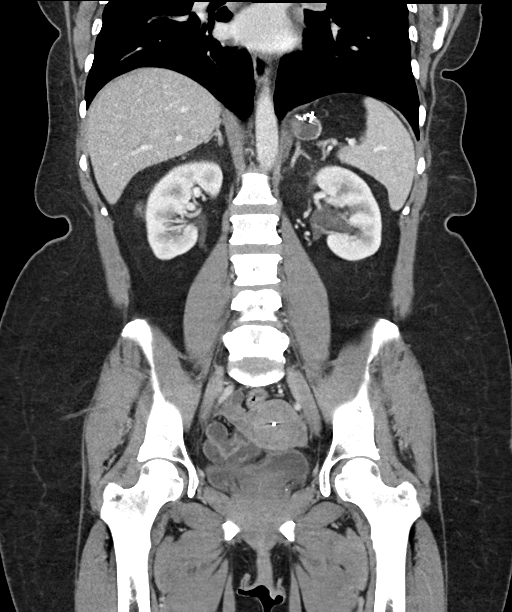

[16 of 46 positions shown; findings below may reference images not displayed]

FINDINGS: Lower chest: Minimal subsegmental atelectasis seen dependently
within the visualized lung bases. Visualized lungs are otherwise
clear.

Hepatobiliary: Liver demonstrates a normal contrast enhanced
appearance. Gallbladder surgically absent. Mild intra and
extrahepatic biliary dilatation likely reflects post cholecystectomy
changes.

Pancreas: Pancreas within normal limits.

Spleen: Spleen within normal limits.

Adrenals/Urinary Tract: Adrenal glands are normal. Kidneys equal
size with symmetric enhancement. No nephrolithiasis, hydronephrosis,
or focal enhancing renal mass. No visible hydroureter. Partially
distended bladder within normal limits.

Stomach/Bowel: Postoperative changes present about the stomach. No
adverse features. No evidence for bowel obstruction. Appendix within
normal limits. No acute inflammatory changes seen about the bowels.

Vascular/Lymphatic: Normal intravascular enhancement seen throughout
the intra-abdominal aorta. Mesenteric vessels patent proximally. No
adenopathy.

Reproductive: IUD in appropriate position within the uterus. 1.4 cm
simple left ovarian cyst noted, likely a functional cyst. Ovaries
otherwise unremarkable.

Other: No free air or fluid.

Musculoskeletal: No acute osseous abnormality. No discrete or
worrisome osseous lesions.
IMPRESSION: 1. No CT evidence for acute intra-abdominal or pelvic process.
2. Postoperative changes about the stomach without adverse features.
3. Status post cholecystectomy.

## 2022-03-18 ENCOUNTER — Ambulatory Visit: Payer: BC Managed Care – PPO | Admitting: Family Medicine

## 2022-04-29 ENCOUNTER — Other Ambulatory Visit: Payer: Self-pay | Admitting: Family Medicine

## 2022-04-30 ENCOUNTER — Other Ambulatory Visit: Payer: Self-pay

## 2022-04-30 MED ORDER — BUTALBITAL-APAP-CAFFEINE 50-325-40 MG PO TABS: 1.0000 | ORAL_TABLET | Freq: Every day | ORAL | 1 refills | Status: DC | PRN

## 2022-04-30 MED ORDER — BUTALBITAL-APAP-CAFFEINE 50-325-40 MG PO TABS: 1.0000 | ORAL_TABLET | Freq: Every day | ORAL | 0 refills | Status: DC | PRN

## 2022-04-30 NOTE — Addendum Note (Signed)
Addended by: Billey Chang on: 04/30/2022 12:58 PM   Modules accepted: Orders

## 2022-04-30 NOTE — Addendum Note (Signed)
Addended by: Billey Chang on: 04/30/2022 01:04 PM   Modules accepted: Orders

## 2022-05-05 ENCOUNTER — Telehealth: Payer: Self-pay | Admitting: Family Medicine

## 2022-05-05 NOTE — Telephone Encounter (Signed)
PATIENT NEEDS ORDERS FOR LABS AND A CALL BACK   Encourage patient to contact the pharmacy for refills or they can request refills through [place]:  Please schedule appointment if longer than 1 year  NEXT APPOINTMENT DATE:  MEDICATION:  pregabalin (LYRICA) 200 MG capsule    Is the patient out of medication? Yes  PHARMACY:  [id number] [address], [place] Phone: [phone]  Fax: [phone]      Let patient know to contact pharmacy at the end of the day to make sure medication is ready.  Please notify patient to allow 48-72 hours to process

## 2022-05-08 MED ORDER — PREGABALIN 200 MG PO CAPS: 200.0000 mg | ORAL_CAPSULE | Freq: Two times a day (BID) | ORAL | 5 refills | Status: DC

## 2022-05-18 ENCOUNTER — Other Ambulatory Visit: Payer: Self-pay | Admitting: Family Medicine

## 2022-07-08 ENCOUNTER — Encounter: Payer: BC Managed Care – PPO | Admitting: Family Medicine

## 2022-07-29 ENCOUNTER — Other Ambulatory Visit: Payer: Self-pay | Admitting: Family Medicine

## 2022-10-21 ENCOUNTER — Other Ambulatory Visit: Payer: Self-pay | Admitting: Family Medicine

## 2022-10-21 NOTE — Telephone Encounter (Signed)
LOS: 02/20/2022  Last Refill Date: 08/14/2021  Qty: 20  Refills: 0

## 2023-02-24 ENCOUNTER — Telehealth: Payer: Self-pay | Admitting: Family Medicine

## 2023-02-24 ENCOUNTER — Encounter: Payer: Self-pay | Admitting: Family Medicine

## 2023-02-24 ENCOUNTER — Ambulatory Visit: Payer: BC Managed Care – PPO | Admitting: Family Medicine

## 2023-02-24 VITALS — BP 120/80 | HR 82 | Temp 98.2°F | Ht 63.0 in | Wt 196.0 lb

## 2023-02-24 DIAGNOSIS — Z800 Family history of malignant neoplasm of digestive organs: Secondary | ICD-10-CM | POA: Diagnosis not present

## 2023-02-24 DIAGNOSIS — Z1211 Encounter for screening for malignant neoplasm of colon: Secondary | ICD-10-CM

## 2023-02-24 DIAGNOSIS — R899 Unspecified abnormal finding in specimens from other organs, systems and tissues: Secondary | ICD-10-CM

## 2023-02-24 LAB — LAB REPORT - SCANNED: EGFR: 60

## 2023-02-24 NOTE — Telephone Encounter (Signed)
Patient's spouse states patient forgot to mention that since she was found to have certain cancer markers, they would like a referral to oncology. Information is listed below.   [facility] [address], [place], [place] [address] Phone: [phone]

## 2023-02-24 NOTE — Progress Notes (Signed)
Subjective  CC: needs colonoscopy We used a remote telehealth interpreter for this interview.  I reviewed her records that she brought with her from [place]. HPI: Lindsay Dennis is a 52 y.o. female who presents to the office today to address the problems listed above in the chief complaint. 52 year old female, recently returned from [place].  Had wellness visit there but was also complaining of abdominal pain.  Brings in lab work showing a elevated CA 19-9.  She was told that she needs a colonoscopy.  Her sister has colon cancer and was treated within the last year with surgery and is reportedly doing better.  Patient denies melena.  She reports she also had an ultrasound showing normal results but I do not have those results.  She has never had a colon cancer screening test done.  We have talked about it.  She fears having the test done.  She has chronic anxiety.  See below for her lab results that were just done in [place]. Of note, she had transferred care to an outside practice over the last 18 months.  She wishes to transfer back.  She does have an appointment scheduled with me for complete physical.  Assessment  1. Abnormal laboratory test   2. Family history of colon cancer   3. Screening for colorectal cancer      Plan  Needs screening colonoscopy, but now has an abnormal cancer antigen test and positive family history: Referred to GI for colonoscopy. I spent a total of 33 minutes for this patient encounter. Time spent included preparation, face-to-face counseling with the patient and coordination of care, review of chart and records, and documentation of the encounter.  Follow up: As scheduled for complete physical 04/07/2023  No orders of the defined types were placed in this encounter.  No orders of the defined types were placed in this encounter.     I reviewed the patients updated PMH, FH, and SocHx.    Patient Active Problem List   Diagnosis Date Noted   Galactorrhea  on left side 11/13/2021   Spouse of alcoholic 10/13/2021   Quit consuming alcohol in remote past 10/13/2021   Family history of colon cancer 08/14/2021   Low back pain 05/05/2021   Gastroesophageal reflux disease without esophagitis 04/17/2021   Hormone replacement therapy (HRT) 04/03/2021   Vitamin B12 deficiency 11/09/2019   Major depression, recurrent, chronic (HCC) 10/09/2019   Migraine headache without aura 10/09/2019   Essential hypertension 10/09/2019   Acquired hypothyroidism 10/09/2019   Gastric bypass status for obesity 10/09/2019   IUD (intrauterine device) in place 10/09/2019   Current Meds  Medication Sig   DULoxetine (CYMBALTA) 60 MG capsule Take 60 mg by mouth daily.   levothyroxine (SYNTHROID) 50 MCG tablet Take 1 tablet (50 mcg total) by mouth daily.   pregabalin (LYRICA) 200 MG capsule Take 1 capsule (200 mg total) by mouth 2 (two) times daily.   propranolol ER (INDERAL LA) 120 MG 24 hr capsule Take 1 capsule (120 mg total) by mouth daily.   Semaglutide, 1 MG/DOSE, (OZEMPIC, 1 MG/DOSE,) 4 MG/3ML SOPN Inject 1 mg into the skin once a week.   SUMAtriptan (IMITREX) 50 MG tablet TAKE 1 TABLET BY MOUTH ONCE FOR 1 DOSE. AS NEEDED FOR MIGRAINE. MAY REPEAT DOSE IN 2 HOURS IF NEEDED    Allergies: Patient is allergic to levsin [hyoscyamine]. Family History: Patient family history includes Colon cancer in her sister; Lung cancer in her maternal grandmother. Social History:  Patient  reports that she has quit smoking. She has never used smokeless tobacco. She reports that she does not drink alcohol and does not use drugs.  Review of Systems: Constitutional: Negative for fever malaise or anorexia Cardiovascular: negative for chest pain Respiratory: negative for SOB or persistent cough Gastrointestinal: negative for abdominal pain  Objective  Vitals: BP 120/80   Pulse 82   Temp 98.2 F (36.8 C)   Ht 5\' 3"  (1.6 m)   Wt 196 lb (88.9 kg)   SpO2 96%   BMI 34.72 kg/m   General: no acute distress , A&Ox3            Commons side effects, risks, benefits, and alternatives for medications and treatment plan prescribed today were discussed, and the patient expressed understanding of the given instructions. Patient is instructed to call or message via MyChart if he/she has any questions or concerns regarding our treatment plan. No barriers to understanding were identified. We discussed Red Flag symptoms and signs in detail. Patient expressed understanding regarding what to do in case of urgent or emergency type symptoms.  Medication list was reconciled, printed and provided to the patient in AVS. Patient instructions and summary information was reviewed with the patient as documented in the AVS. This note was prepared with assistance of Dragon voice recognition software. Occasional wrong-word or sound-a-like substitutions may have occurred due to the inherent limitations of voice recognition software

## 2023-03-03 ENCOUNTER — Ambulatory Visit: Payer: BC Managed Care – PPO | Admitting: Family Medicine

## 2023-03-03 ENCOUNTER — Encounter: Payer: Self-pay | Admitting: Family Medicine

## 2023-03-03 VITALS — BP 130/78 | HR 81 | Temp 97.7°F | Ht 63.0 in | Wt 201.6 lb

## 2023-03-03 DIAGNOSIS — Z1159 Encounter for screening for other viral diseases: Secondary | ICD-10-CM | POA: Diagnosis not present

## 2023-03-03 DIAGNOSIS — R1084 Generalized abdominal pain: Secondary | ICD-10-CM

## 2023-03-03 DIAGNOSIS — Z114 Encounter for screening for human immunodeficiency virus [HIV]: Secondary | ICD-10-CM

## 2023-03-03 LAB — CBC WITH DIFFERENTIAL/PLATELET
Basophils Absolute: 0 10*3/uL (ref 0.0–0.1)
Basophils Relative: 0.9 % (ref 0.0–3.0)
Eosinophils Absolute: 0.3 10*3/uL (ref 0.0–0.7)
Eosinophils Relative: 7.1 % — ABNORMAL HIGH (ref 0.0–5.0)
HCT: 39.5 % (ref 36.0–46.0)
Hemoglobin: 12.7 g/dL (ref 12.0–15.0)
Lymphocytes Relative: 35.2 % (ref 12.0–46.0)
Lymphs Abs: 1.5 10*3/uL (ref 0.7–4.0)
MCHC: 32.3 g/dL (ref 30.0–36.0)
MCV: 82.1 fL (ref 78.0–100.0)
Monocytes Absolute: 0.3 10*3/uL (ref 0.1–1.0)
Monocytes Relative: 7.4 % (ref 3.0–12.0)
Neutro Abs: 2.1 10*3/uL (ref 1.4–7.7)
Neutrophils Relative %: 49.4 % (ref 43.0–77.0)
Platelets: 276 10*3/uL (ref 150.0–400.0)
RBC: 4.81 Mil/uL (ref 3.87–5.11)
RDW: 15.7 % — ABNORMAL HIGH (ref 11.5–15.5)
WBC: 4.2 10*3/uL (ref 4.0–10.5)

## 2023-03-03 LAB — LIPASE: Lipase: 28 U/L (ref 11.0–59.0)

## 2023-03-03 NOTE — Patient Instructions (Signed)
VISIT SUMMARY:  During today's visit, we discussed your recent health concerns, including the abnormal cancer markers from your blood work in [place], your general feeling of malaise, and your desire to be screened for Hepatitis C. We also reviewed your need for a physical examination.  YOUR PLAN:  -HEPATITIS C SCREENING: Hepatitis C is a liver infection caused by the Hepatitis C virus. We have scheduled a screening test for you to check for this infection, as you have not been screened before.  -ELEVATED CANCER MARKERS: Elevated cancer markers can sometimes indicate the presence of cancer or other conditions. You are experiencing stress and general discomfort, and we have referred you for a colonoscopy to investigate further. Your liver tests were normal, which is a positive sign.  -GENERAL MALAISE AND ABDOMINAL DISCOMFORT: General malaise means feeling unwell without a specific cause. You have been feeling this way for a few weeks, with abdominal discomfort. We will conduct blood work to help identify any underlying issues.  -GENERAL HEALTH MAINTENANCE: It is important to have regular physical examinations to monitor your overall health. Your last physical was a year ago, so we need to schedule another one.  INSTRUCTIONS:  Please await contact from the medical center for your colonoscopy appointment. Additionally, ensure you schedule a physical examination as soon as possible. Follow up with the Hepatitis C screening and blood work as ordered.

## 2023-03-03 NOTE — Progress Notes (Signed)
Subjective  CC:  Chief Complaint  Patient presents with   Liver test    Pt would like to have a liver test done     HPI: Lindsay Dennis is a 52 y.o. female who presents to the office today to address the problems listed above in the chief complaint. Discussed the use of AI scribe software for clinical note transcription with the patient, who gave verbal consent to proceed.  History of Present Illness   The patient, with a history of abnormal cancer markers from a recent blood work in [place], presents with concerns about her health. She reports feeling unwell for the past few weeks, with a sensation of something being wrong internally. She describes this as not being a stomach issue but something else inside. The patient is experiencing significant stress due to these symptoms and the abnormal cancer markers. She has not previously been screened for Hepatitis C, but wishes to do so now, despite understanding that it is unrelated to the cancer markers. The patient is also awaiting contact from a medical center for a scheduled colonoscopy.      Assessment  1. Generalized abdominal pain   2. Need for hepatitis C screening test   3. Encounter for screening for HIV      Plan  Assessment and Plan    Hepatitis C Screening Scheduled for Hepatitis C screening. No prior history of screening. Concerned about overall health due to recent abnormal cancer markers and general malaise. Explained that Hepatitis C screening is unrelated to cancer markers. - Order Hepatitis C screening  Elevated Cancer Markers Reports elevated cancer markers from recent blood work done in [place]. Experiencing significant stress and general malaise, including abdominal discomfort. Referral for a colonoscopy has been sent. Liver tests were normal, which is reassuring. - Await contact from the medical center for colonoscopy scheduling  General Malaise and Abdominal Discomfort Reports feeling unwell for the past  few weeks, with symptoms including abdominal discomfort. Exact cause is unclear. Blood work will help investigate underlying causes. - Order blood work to investigate underlying causes  General Health Maintenance Needs to schedule physical examination, last done a year ago. - Schedule physical examination.        Follow up: cpe 04/07/2023  Orders Placed This Encounter  Procedures   Hepatitis B core antibody, total   COMPLETE METABOLIC PANEL WITH GFR   CBC with Differential/Platelet   Lipase   HIV Antibody (routine testing w rflx)   No orders of the defined types were placed in this encounter.     I reviewed the patients updated PMH, FH, and SocHx.    Patient Active Problem List   Diagnosis Date Noted   Galactorrhea on left side 11/13/2021   Spouse of alcoholic 10/13/2021   Quit consuming alcohol in remote past 10/13/2021   Family history of colon cancer 08/14/2021   Low back pain 05/05/2021   Gastroesophageal reflux disease without esophagitis 04/17/2021   Hormone replacement therapy (HRT) 04/03/2021   Vitamin B12 deficiency 11/09/2019   Major depression, recurrent, chronic (HCC) 10/09/2019   Migraine headache without aura 10/09/2019   Essential hypertension 10/09/2019   Acquired hypothyroidism 10/09/2019   Gastric bypass status for obesity 10/09/2019   IUD (intrauterine device) in place 10/09/2019   Current Meds  Medication Sig   DULoxetine (CYMBALTA) 60 MG capsule Take 60 mg by mouth daily.   levothyroxine (SYNTHROID) 50 MCG tablet Take 1 tablet (50 mcg total) by mouth daily.   pregabalin (  LYRICA) 200 MG capsule Take 1 capsule (200 mg total) by mouth 2 (two) times daily.   propranolol ER (INDERAL LA) 120 MG 24 hr capsule Take 1 capsule (120 mg total) by mouth daily.   Semaglutide, 1 MG/DOSE, (OZEMPIC, 1 MG/DOSE,) 4 MG/3ML SOPN Inject 1 mg into the skin once a week.   SUMAtriptan (IMITREX) 50 MG tablet TAKE 1 TABLET BY MOUTH ONCE FOR 1 DOSE. AS NEEDED FOR MIGRAINE.  MAY REPEAT DOSE IN 2 HOURS IF NEEDED    Allergies: Patient is allergic to levsin [hyoscyamine]. Family History: Patient family history includes Colon cancer in her sister; Lung cancer in her maternal grandmother. Social History:  Patient  reports that she has quit smoking. She has never used smokeless tobacco. She reports that she does not drink alcohol and does not use drugs.  Review of Systems: Constitutional: Negative for fever malaise or anorexia Cardiovascular: negative for chest pain Respiratory: negative for SOB or persistent cough Gastrointestinal: negative for abdominal pain  Objective  Vitals: BP 130/78   Pulse 81   Temp 97.7 F (36.5 C)   Ht 5\' 3"  (1.6 m)   Wt 201 lb 9.6 oz (91.4 kg)   SpO2 95%   BMI 35.71 kg/m  General: no acute distress , A&Ox3  Commons side effects, risks, benefits, and alternatives for medications and treatment plan prescribed today were discussed, and the patient expressed understanding of the given instructions. Patient is instructed to call or message via MyChart if he/she has any questions or concerns regarding our treatment plan. No barriers to understanding were identified. We discussed Red Flag symptoms and signs in detail. Patient expressed understanding regarding what to do in case of urgent or emergency type symptoms.  Medication list was reconciled, printed and provided to the patient in AVS. Patient instructions and summary information was reviewed with the patient as documented in the AVS. This note was prepared with assistance of Dragon voice recognition software. Occasional wrong-word or sound-a-like substitutions may have occurred due to the inherent limitations of voice recognition software

## 2023-03-04 ENCOUNTER — Encounter: Payer: Self-pay | Admitting: Family Medicine

## 2023-03-04 LAB — COMPLETE METABOLIC PANEL WITH GFR
AG Ratio: 1.5 (calc) (ref 1.0–2.5)
ALT: 7 U/L (ref 6–29)
AST: 9 U/L — ABNORMAL LOW (ref 10–35)
Albumin: 3.5 g/dL — ABNORMAL LOW (ref 3.6–5.1)
Alkaline phosphatase (APISO): 32 U/L — ABNORMAL LOW (ref 37–153)
BUN: 10 mg/dL (ref 7–25)
CO2: 26 mmol/L (ref 20–32)
Calcium: 8.8 mg/dL (ref 8.6–10.4)
Chloride: 109 mmol/L (ref 98–110)
Creat: 0.69 mg/dL (ref 0.50–1.03)
Globulin: 2.3 g/dL (ref 1.9–3.7)
Glucose, Bld: 103 mg/dL — ABNORMAL HIGH (ref 65–99)
Potassium: 4.3 mmol/L (ref 3.5–5.3)
Sodium: 142 mmol/L (ref 135–146)
Total Bilirubin: 0.3 mg/dL (ref 0.2–1.2)
Total Protein: 5.8 g/dL — ABNORMAL LOW (ref 6.1–8.1)
eGFR: 105 mL/min/{1.73_m2} (ref >=60)

## 2023-03-04 LAB — HIV ANTIBODY (ROUTINE TESTING W REFLEX): HIV 1&2 Ab, 4th Generation: NONREACTIVE

## 2023-03-04 LAB — HEPATITIS B CORE ANTIBODY, TOTAL: Hep B Core Total Ab: REACTIVE — AB

## 2023-03-04 NOTE — Addendum Note (Signed)
Addended by: Asencion Partridge on: 03/04/2023 06:52 PM   Modules accepted: Orders

## 2023-03-04 NOTE — Progress Notes (Signed)
Please add additional labs that I ordered  She can return for more blood work if needed.  Thanks, Dr. Mardelle Matte ' I am still waiting on the hepatitis C screen however a + hep B test returned. Have you ever been told you have hepatitis B? Your liver tests are ok. They are even on the low side. I will tell you more when the other tests return.  Dr. Mardelle Matte

## 2023-03-05 NOTE — Telephone Encounter (Signed)
Sent note in results section. See there

## 2023-03-05 NOTE — Progress Notes (Signed)
Lindsay Dennis is going to add labs on and if not enough she will contact pt to come in

## 2023-03-12 ENCOUNTER — Telehealth: Payer: Self-pay | Admitting: Family Medicine

## 2023-03-12 NOTE — Telephone Encounter (Signed)
Pt called an would like a call back in regards to the referral for a Colonoscopy. Please advise.

## 2023-03-18 ENCOUNTER — Encounter: Payer: Self-pay | Admitting: Gastroenterology

## 2023-03-22 ENCOUNTER — Encounter: Payer: Self-pay | Admitting: Family Medicine

## 2023-03-22 ENCOUNTER — Encounter: Payer: Self-pay | Admitting: Gastroenterology

## 2023-03-23 ENCOUNTER — Encounter: Payer: Self-pay | Admitting: Family Medicine

## 2023-03-24 ENCOUNTER — Other Ambulatory Visit: Payer: Self-pay

## 2023-03-24 DIAGNOSIS — R899 Unspecified abnormal finding in specimens from other organs, systems and tissues: Secondary | ICD-10-CM

## 2023-03-24 DIAGNOSIS — Z1159 Encounter for screening for other viral diseases: Secondary | ICD-10-CM

## 2023-03-29 ENCOUNTER — Telehealth: Payer: Self-pay | Admitting: Family Medicine

## 2023-03-29 NOTE — Telephone Encounter (Signed)
Pt's spouse stated pt had Cancer Antigen tests in another [place] some time ago. Want to have them redone to see if there were any changes.  As follows: A125 ICD-9 141 : 15,40 A15.3 ICD-9 143 : 20,70 A19.9 ICD-9 145 : 81,49  This is all the information that was given to me. They showed the above from their phone.  Not sure how to interpret this.

## 2023-03-29 NOTE — Telephone Encounter (Signed)
Prescription Request  03/29/2023  LOV: 03/03/2023  What is the name of the medication or equipment? DULoxetine (CYMBALTA) 60 MG capsule   Have you contacted your pharmacy to request a refill? Yes   Which pharmacy would you like this sent to?   [id number] [place], [place] - [address] Phone: [phone]  Fax: [phone]     Patient notified that their request is being sent to the clinical staff for review and that they should receive a response within 2 business days.   Please advise at Mobile [phone] (mobile)

## 2023-03-30 MED ORDER — DULOXETINE HCL 60 MG PO CPEP: 60.0000 mg | ORAL_CAPSULE | Freq: Every day | ORAL | 3 refills | Status: DC

## 2023-03-30 NOTE — Telephone Encounter (Signed)
Message sent thru MyChart 

## 2023-03-31 NOTE — Telephone Encounter (Signed)
Pt called back and states they are out of bottom RX. Please advise.

## 2023-04-07 ENCOUNTER — Encounter: Payer: Self-pay | Admitting: Family Medicine

## 2023-04-07 ENCOUNTER — Ambulatory Visit (INDEPENDENT_AMBULATORY_CARE_PROVIDER_SITE_OTHER): Payer: BC Managed Care – PPO | Admitting: Family Medicine

## 2023-04-07 VITALS — BP 130/72 | HR 74 | Temp 97.9°F | Ht 63.0 in | Wt 204.6 lb

## 2023-04-07 DIAGNOSIS — I10 Essential (primary) hypertension: Secondary | ICD-10-CM | POA: Diagnosis not present

## 2023-04-07 DIAGNOSIS — F339 Major depressive disorder, recurrent, unspecified: Secondary | ICD-10-CM

## 2023-04-07 DIAGNOSIS — Z1159 Encounter for screening for other viral diseases: Secondary | ICD-10-CM

## 2023-04-07 DIAGNOSIS — E039 Hypothyroidism, unspecified: Secondary | ICD-10-CM

## 2023-04-07 DIAGNOSIS — Z9884 Bariatric surgery status: Secondary | ICD-10-CM

## 2023-04-07 DIAGNOSIS — R768 Other specified abnormal immunological findings in serum: Secondary | ICD-10-CM | POA: Diagnosis not present

## 2023-04-07 DIAGNOSIS — E538 Deficiency of other specified B group vitamins: Secondary | ICD-10-CM

## 2023-04-07 DIAGNOSIS — Z975 Presence of (intrauterine) contraceptive device: Secondary | ICD-10-CM

## 2023-04-07 DIAGNOSIS — Z0001 Encounter for general adult medical examination with abnormal findings: Secondary | ICD-10-CM

## 2023-04-07 DIAGNOSIS — Z23 Encounter for immunization: Secondary | ICD-10-CM | POA: Diagnosis not present

## 2023-04-07 DIAGNOSIS — M545 Low back pain, unspecified: Secondary | ICD-10-CM

## 2023-04-07 DIAGNOSIS — Z800 Family history of malignant neoplasm of digestive organs: Secondary | ICD-10-CM

## 2023-04-07 DIAGNOSIS — G8929 Other chronic pain: Secondary | ICD-10-CM

## 2023-04-07 NOTE — Progress Notes (Signed)
Subjective  Chief Complaint  Patient presents with   Annual Exam    Pt here for Annual Exam and is currently fasting. Colonoscopy is scheduled for 05/10/2023   Hypertension    HPI: Lindsay Dennis is a 52 y.o. female who presents to [facility] [place] [facility] today for a Female Wellness Visit. She also has the concerns and/or needs as listed above in the chief complaint. These will be addressed in addition to the Health Maintenance Visit.   Use of interpreter for interview  Wellness Visit: annual visit with health maintenance review and exam  HM: overdue for mammogram. Pap smear current. Has GI appt for colonoscopy scheduled in January. Eligible for shingrix, flu and tdap. Doing well.  Chronic disease f/u and/or acute problem visit: (deemed necessary to be done in addition to the wellness visit): Hep B core ab positive: labs done due to pt's concerns regarding abdominal pain. No h/o hepatitis infection. She has been vaccinated. No jaundice.  + sister colon cancer and elevated CA 19 screen done in [place]. She is extremely concerned. Understands that these antigen tests are not typically used for screening. No blood in stool. No melena. No weight loss.  HTN on propranolol and doing well. No cp.  Low thyroid; testing in [place] was controlled. Feels stalbe. On levtx daily. Mood is improved on cymbalta; not completely normal but definitely improved and better than when on prior effexor.  Chronic back pain improved on lyrica; sm has evaluated.  Assessment  1. Encounter for well adult exam with abnormal findings   2. Hepatitis B core antibody positive   3. Need for hepatitis C screening test   4. Family history of colon cancer   5. Vitamin B12 deficiency   6. Essential hypertension   7. Acquired hypothyroidism   8. Need for Tdap vaccination   9. Need for shingles vaccine   10. Major depression, recurrent, chronic (HCC)   11. Chronic bilateral low back pain without  sciatica   12. Gastric bypass status for obesity   13. IUD (intrauterine device) in place      Plan  Female Wellness Visit: Age appropriate Health Maintenance and Prevention measures were discussed with patient. Included topics are cancer screening recommendations, ways to keep healthy (see AVS) including dietary and exercise recommendations, regular eye and dental care, use of seat belts, and avoidance of moderate alcohol use and tobacco use. Pt to schedule mammogram. Colonoscopy appt scheduled.  BMI: discussed patient's BMI and encouraged positive lifestyle modifications to help get to or maintain a target BMI. HM needs and immunizations were addressed and ordered. See below for orders. See HM and immunization section for updates. Tdap and shingrix #1 given today. Pt defers flu Routine labs and screening tests ordered including cmp, cbc and lipids where appropriate. Discussed recommendations regarding Vit D and calcium supplementation (see AVS)  Chronic disease management visit and/or acute problem visit: At risk for colon cancer: to GI and pt requesting recheck CA19-9 antigen BP is controlled. Continue cymbalta at 60 daily and monitor response. Counseling done Recheck b12 Recheck tsh Continue lyrica  Follow up: 6 mo for recheck and 2nd shingrix  Orders Placed This Encounter  Procedures   Tdap vaccine greater than or equal to 7yo IM   Zoster Recombinant (Shingrix )   Hepatitis B surface antigen   Hepatitis B surface antibody,qualitative   Hepatitis C antibody   Cancer antigen 19-9   Lipid panel   TSH   No orders  of the defined types were placed in this encounter.     Body mass index is 36.24 kg/m. Wt Readings from Last 3 Encounters:  04/07/23 204 lb 9.6 oz (92.8 kg)  03/03/23 201 lb 9.6 oz (91.4 kg)  02/24/23 196 lb (88.9 kg)     Patient Active Problem List   Diagnosis Date Noted Date Diagnosed   Galactorrhea on left side 11/13/2021     Neg mammo and utrasound; non  spontaneous. Associated with fibrocystic breast changes. 10/2021    Spouse of alcoholic 10/13/2021    Quit consuming alcohol in remote past 10/13/2021    Family history of colon cancer 08/14/2021    Low back pain 05/05/2021    Gastroesophageal reflux disease without esophagitis 04/17/2021     EGD in [place] 2022; pt reports normal. On chronic PPI    Hormone replacement therapy (HRT) 04/03/2021    Vitamin B12 deficiency 11/09/2019    Major depression, recurrent, chronic (HCC) 10/09/2019    Migraine headache without aura 10/09/2019    Essential hypertension 10/09/2019    Acquired hypothyroidism 10/09/2019    Gastric bypass status for obesity 10/09/2019     Gastric sleeve: 2004    IUD (intrauterine device) in place 10/09/2019     mirena placed 2018 in [place].  3rd IUD    Health Maintenance  Topic Date Due   Hepatitis C Screening  Never done   Colonoscopy  Never done   MAMMOGRAM  11/13/2022   COVID-19 Vaccine (3 - 2023-24 season) 04/23/2023 (Originally 12/27/2022)   INFLUENZA VACCINE  07/26/2023 (Originally 11/26/2022)   Zoster Vaccines- Shingrix (2 of 2) 06/02/2023   Cervical Cancer Screening (HPV/Pap Cotest)  02/04/2026   DTaP/Tdap/Td (2 - Td or Tdap) 04/06/2033   HIV Screening  Completed   HPV VACCINES  Aged Out   Immunization History  Administered Date(s) Administered   PFIZER(Purple Top)SARS-COV-2 Vaccination 08/18/2019, 09/08/2019   Tdap 04/07/2023   Zoster Recombinant(Shingrix) 04/07/2023   We updated and reviewed the patient's past history in detail and it is documented below. Allergies: Patient is allergic to levsin [hyoscyamine]. Past Medical History Patient  has a past medical history of Acquired hypothyroidism (10/09/2019), Essential hypertension (10/09/2019), Major depression, recurrent, chronic (HCC) (10/09/2019), and Migraine headache without aura (10/09/2019). Past Surgical History Patient  has a past surgical history that includes Sleeve Gastroplasty (2018) and  Cholecystectomy (2004). Family History: Patient family history includes Colon cancer in her sister; Lung cancer in her maternal grandmother. Social History:  Patient  reports that she has quit smoking. She has never used smokeless tobacco. She reports that she does not drink alcohol and does not use drugs.  Review of Systems: Constitutional: negative for fever or malaise Ophthalmic: negative for photophobia, double vision or loss of vision Cardiovascular: negative for chest pain, dyspnea on exertion, or new LE swelling Respiratory: negative for SOB or persistent cough Gastrointestinal: negative for abdominal pain, change in bowel habits or melena Genitourinary: negative for dysuria or gross hematuria, no abnormal uterine bleeding or disharge Musculoskeletal: negative for new gait disturbance or muscular weakness Integumentary: negative for new or persistent rashes, no breast lumps Neurological: negative for TIA or stroke symptoms Psychiatric: negative for SI or delusions Allergic/Immunologic: negative for hives  Patient Care Team    Relationship Specialty Notifications Start End  Willow Ora, MD PCP - General Family Medicine  02/23/23   Armbruster, Willaim Rayas, MD Consulting Physician Gastroenterology  04/03/21   Charlett Nose, MD Consulting Physician Obstetrics and Gynecology  04/03/21  Elie Confer, NP Nurse Practitioner Family Medicine  10/21/22     Objective  Vitals: BP 130/72   Pulse 74   Temp 97.9 F (36.6 C)   Ht 5\' 3"  (1.6 m)   Wt 204 lb 9.6 oz (92.8 kg)   SpO2 98%   BMI 36.24 kg/m  General:  Well developed, well nourished, no acute distress  Psych:  Alert and orientedx3,normal mood and affect HEENT:  Normocephalic, atraumatic, non-icteric sclera,  supple neck without adenopathy, mass or thyromegaly Cardiovascular:  Normal S1, S2, RRR without gallop, rub or murmur Respiratory:  Good breath sounds bilaterally, CTAB with normal respiratory  effort Gastrointestinal: normal bowel sounds, soft, non-tender, no noted masses. No HSM MSK: extremities without edema, joints without erythema or swelling Neurologic:    Mental status is normal.  Gross motor and sensory exams are normal.  No tremor  Commons side effects, risks, benefits, and alternatives for medications and treatment plan prescribed today were discussed, and the patient expressed understanding of the given instructions. Patient is instructed to call or message via MyChart if he/she has any questions or concerns regarding our treatment plan. No barriers to understanding were identified. We discussed Red Flag symptoms and signs in detail. Patient expressed understanding regarding what to do in case of urgent or emergency type symptoms.  Medication list was reconciled, printed and provided to the patient in AVS. Patient instructions and summary information was reviewed with the patient as documented in the AVS. This note was prepared with assistance of Dragon voice recognition software. Occasional wrong-word or sound-a-like substitutions may have occurred due to the inherent limitations of voice recognition software

## 2023-04-07 NOTE — Patient Instructions (Signed)
 Please return in 6 months for hypertension follow up.   I will release your lab results to you on your MyChart account with further instructions. You may see the results before I do, but when I review them I will send you a message with my report or have my assistant call you if things need to be discussed. Please reply to my message with any questions. Thank you!   If you have any questions or concerns, please don't hesitate to send me a message via MyChart or call the office at [phone]. Thank you for visiting with [place] today! It's our pleasure caring for you.   Please call the office checked below to schedule your appointment for your mammogram and/or bone density screen (the checked studies were ordered): [x]   Mammogram  []   Bone Density  [x]   [facility]     [address] [place], [place]        [phone]         []   [facility]  [address] [place], [place]  [phone]

## 2023-04-08 LAB — HEPATITIS C ANTIBODY: Hepatitis C Ab: NONREACTIVE

## 2023-04-08 LAB — LIPID PANEL
Cholesterol: 211 mg/dL — ABNORMAL HIGH (ref 0–200)
HDL: 49.6 mg/dL (ref >39.00)
LDL Cholesterol: 140 mg/dL — ABNORMAL HIGH (ref 0–99)
NonHDL: 161.68
Total CHOL/HDL Ratio: 4
Triglycerides: 107 mg/dL (ref 0.0–149.0)
VLDL: 21.4 mg/dL (ref 0.0–40.0)

## 2023-04-08 LAB — HEPATITIS B SURFACE ANTIBODY,QUALITATIVE: Hep B S Ab: REACTIVE — AB

## 2023-04-08 LAB — HEPATITIS B SURFACE ANTIGEN: Hepatitis B Surface Ag: NONREACTIVE

## 2023-04-08 LAB — CANCER ANTIGEN 19-9: CA 19-9: 49 U/mL — ABNORMAL HIGH (ref <34)

## 2023-04-08 LAB — TSH: TSH: 0.46 u[IU]/mL (ref 0.35–5.50)

## 2023-04-13 ENCOUNTER — Encounter: Payer: Self-pay | Admitting: Family Medicine

## 2023-04-13 DIAGNOSIS — B181 Chronic viral hepatitis B without delta-agent: Secondary | ICD-10-CM | POA: Insufficient documentation

## 2023-04-13 DIAGNOSIS — R978 Other abnormal tumor markers: Secondary | ICD-10-CM | POA: Insufficient documentation

## 2023-04-13 HISTORY — DX: Other abnormal tumor markers: R97.8

## 2023-04-13 NOTE — Progress Notes (Signed)
See mychart note  The 10-year ASCVD risk score (Arnett DK, et al., 2019) is: 2.3%   Values used to calculate the score:     Age: 52 years     Sex: Female     Is Non-Hispanic African American: No     Diabetic: No     Tobacco smoker: No     Systolic Blood Pressure: 130 mmHg     Is BP treated: Yes     HDL Cholesterol: 49.6 mg/dL     Total Cholesterol: 211 mg/dL  Dear Ms. Shippey, Your results show a mildly elevated CA19-9.  Your liver tests do not show an active hepatitis B infection so good news. Instead, they are consistent with an inactive carrier state; you needn't worry about this as it is not an active infection. Your liver tests remain normal. Your cholesterol is borderline high so eat a low fat diet.  Your thyroid is normal on your current dose of levothyroxine.  No changes are needed at this time.  Happy Holidays. Sincerely, Dr. Mardelle Matte

## 2023-04-14 ENCOUNTER — Other Ambulatory Visit: Payer: Self-pay | Admitting: Family Medicine

## 2023-04-14 ENCOUNTER — Encounter: Payer: Self-pay | Admitting: Family Medicine

## 2023-04-14 DIAGNOSIS — Z1231 Encounter for screening mammogram for malignant neoplasm of breast: Secondary | ICD-10-CM

## 2023-04-30 ENCOUNTER — Other Ambulatory Visit: Payer: Self-pay | Admitting: Family Medicine

## 2023-04-30 DIAGNOSIS — I10 Essential (primary) hypertension: Secondary | ICD-10-CM

## 2023-05-04 ENCOUNTER — Ambulatory Visit (AMBULATORY_SURGERY_CENTER): Payer: BC Managed Care – PPO | Admitting: *Deleted

## 2023-05-04 ENCOUNTER — Telehealth: Payer: Self-pay | Admitting: *Deleted

## 2023-05-04 VITALS — Ht 63.0 in | Wt 220.0 lb

## 2023-05-04 DIAGNOSIS — Z800 Family history of malignant neoplasm of digestive organs: Secondary | ICD-10-CM

## 2023-05-04 DIAGNOSIS — Z1211 Encounter for screening for malignant neoplasm of colon: Secondary | ICD-10-CM

## 2023-05-04 MED ORDER — NA SULFATE-K SULFATE-MG SULF 17.5-3.13-1.6 GM/177ML PO SOLN: 1.0000 | Freq: Once | ORAL | 0 refills | Status: AC

## 2023-05-04 NOTE — Progress Notes (Signed)
 Pt's name and DOB verified at the beginning of the pre-visit wit 2 identifiers  Pt denies any difficulty with ambulating,sitting, laying down or rolling side to side  Pt has no issues with ambulation   Pt has no issues moving head neck or swallowing  No egg or soy allergy known to patient   No issues known to pt with past sedation with any surgeries or procedures  No FH of Malignant Hyperthermia  Pt is not on diet pills or shots  Pt is not on home 02   Pt is not on blood thinners   Pt denies issues with constipation   Pt is not on dialysis  Pt denise any abnormal heart rhythms   Pt denies any upcoming cardiac testing  Pt encouraged to use to use Singlecare or Goodrx to reduce cost   Patient's chart reviewed by Norleen Schillings CNRA prior to pre-visit and patient appropriate for the [facility].  Pre-visit completed and red dot placed by patient's name on their procedure day (on provider's schedule).  .  Visit over phone  with interpretor for Polish #   Pt states weight is 220 lb  Instructed pt why it is important to and  to call if they have any changes in health or new medications. Directed them to the # given and on instructions.     Instructions reviewed. Pt given both [facility] main # and MD on call # prior to instructions.  Pt states understanding. Instructed to review again prior to procedure. Pt states they will.   Instructions given to pt mail with coupon and by My Chart  Coupon sent via text to mobile phone and pt verified they received it

## 2023-05-04 NOTE — Telephone Encounter (Signed)
 Interpretor # [phone]

## 2023-05-05 ENCOUNTER — Encounter: Payer: Self-pay | Admitting: Gastroenterology

## 2023-05-05 DIAGNOSIS — Z1231 Encounter for screening mammogram for malignant neoplasm of breast: Secondary | ICD-10-CM

## 2023-05-10 ENCOUNTER — Ambulatory Visit: Payer: BC Managed Care – PPO | Admitting: Gastroenterology

## 2023-05-10 ENCOUNTER — Encounter: Payer: Self-pay | Admitting: Gastroenterology

## 2023-05-10 VITALS — BP 114/63 | HR 64 | Temp 98.4°F | Resp 11 | Ht 63.0 in | Wt 220.0 lb

## 2023-05-10 DIAGNOSIS — Z1211 Encounter for screening for malignant neoplasm of colon: Secondary | ICD-10-CM | POA: Diagnosis present

## 2023-05-10 DIAGNOSIS — K648 Other hemorrhoids: Secondary | ICD-10-CM | POA: Diagnosis not present

## 2023-05-10 DIAGNOSIS — D123 Benign neoplasm of transverse colon: Secondary | ICD-10-CM | POA: Diagnosis not present

## 2023-05-10 DIAGNOSIS — K635 Polyp of colon: Secondary | ICD-10-CM | POA: Diagnosis not present

## 2023-05-10 DIAGNOSIS — D120 Benign neoplasm of cecum: Secondary | ICD-10-CM

## 2023-05-10 DIAGNOSIS — Z800 Family history of malignant neoplasm of digestive organs: Secondary | ICD-10-CM | POA: Diagnosis not present

## 2023-05-10 MED ORDER — SODIUM CHLORIDE 0.9 % IV SOLN: 500.0000 mL | Freq: Once | INTRAVENOUS | Status: DC

## 2023-05-10 NOTE — Progress Notes (Signed)
 Sedate, gd SR, tolerated procedure well, VSS, report to RN

## 2023-05-10 NOTE — Op Note (Signed)
 [facility] Patient Name: Omar Orrego Procedure Date: 05/10/2023 3:05 PM MRN: [id number] Endoscopist: Elspeth P. Leigh , MD, [id number] Age: 53 Referring MD:  Date of Birth: [birth year] Gender: Female Account #: [id number] Procedure:                Colonoscopy Indications:              Screening in patient at increased risk: Family                            history of 1st-degree relative with colorectal                            cancer (sister dx age 37). first exam Medicines:                Monitored Anesthesia Care Procedure:                Pre-Anesthesia Assessment:                           - Prior to the procedure, a History and Physical                            was performed, and patient medications and                            allergies were reviewed. The patient's tolerance of                            previous anesthesia was also reviewed. The risks                            and benefits of the procedure and the sedation                            options and risks were discussed with the patient.                            All questions were answered, and informed consent                            was obtained. Prior Anticoagulants: The patient has                            taken no anticoagulant or antiplatelet agents. ASA                            Grade Assessment: II - A patient with mild systemic                            disease. After reviewing the risks and benefits,                            the patient was deemed in satisfactory condition to  undergo the procedure.                           After obtaining informed consent, the colonoscope                            was passed under direct vision. Throughout the                            procedure, the patient's blood pressure, pulse, and                            oxygen saturations were monitored continuously. The                            CF [id number]  #[id number] was introduced through the anus                            and advanced to the the cecum, identified by                            appendiceal orifice and ileocecal valve. The                            colonoscopy was performed without difficulty. The                            patient tolerated the procedure well. The quality                            of the bowel preparation was good. The ileocecal                            valve, appendiceal orifice, and rectum were                            photographed. Scope In: 3:13:45 PM Scope Out: 3:31:48 PM Scope Withdrawal Time: 0 hours 14 minutes 42 seconds  Total Procedure Duration: 0 hours 18 minutes 3 seconds  Findings:                 The perianal and digital rectal examinations were                            normal.                           A diminutive polyp was found in the cecum. The                            polyp was sessile. The polyp was removed with a                            cold snare. Resection and retrieval were complete.  A 3 mm polyp was found in the hepatic flexure. The                            polyp was flat. The polyp was removed with a cold                            snare. Resection and retrieval were complete.                           Internal hemorrhoids were found during retroflexion.                           The exam was otherwise without abnormality. Of                            note, residual stool noted throughout the colon                            which led to lavage for several minutes but                            adequate views obtained. Complications:            No immediate complications. Estimated blood loss:                            Minimal. Estimated Blood Loss:     Estimated blood loss was minimal. Impression:               - One diminutive polyp in the cecum, removed with a                            cold snare. Resected and retrieved.                            - One 3 mm polyp at the hepatic flexure, removed                            with a cold snare. Resected and retrieved.                           - Internal hemorrhoids.                           - The examination was otherwise normal. Recommendation:           - Patient has a contact number available for                            emergencies. The signs and symptoms of potential                            delayed complications were discussed with the  patient. Return to normal activities tomorrow.                            Written discharge instructions were provided to the                            patient.                           - Resume previous diet.                           - Continue present medications.                           - Await pathology results.                           - Anticipate repeat colonoscopy in 5 years given                            strong family history of colon cancer Leatrice Parilla P. Naftula Donahue, MD 05/10/2023 3:36:27 PM This report has been signed electronically.

## 2023-05-10 NOTE — Progress Notes (Signed)
 Pt's states no medical or surgical changes since previsit or office visit.   Video Interpreter used today at the [facility] for this pt.  Interpreter's number [id number] Mardene Celeste

## 2023-05-10 NOTE — Progress Notes (Signed)
 [facility] History and Physical   Primary Care Physician:  Jodie Lavern CROME, MD   Reason for Procedure:   Family history of colon cancer - sister  Plan:    colonoscopy     HPI: Chiquetta Musolf is a 53 y.o. female  here for colonoscopy screening - first time exam, sister had colon cancer dx age 62.   She does have some constipation. Otherwise feels well without any cardiopulmonary symptoms.   I have discussed risks / benefits of anesthesia and endoscopic procedure with Zia Chargois and they wish to proceed with the exams as outlined today.    Past Medical History:  Diagnosis Date   Acquired hypothyroidism 10/09/2019   Anemia    Essential hypertension 10/09/2019   GERD (gastroesophageal reflux disease)    Hyperlipidemia    Major depression, recurrent, chronic (HCC) 10/09/2019   Migraine headache without aura 10/09/2019    Past Surgical History:  Procedure Laterality Date   CHOLECYSTECTOMY  2004   SLEEVE GASTROPLASTY  2018   teeth removal      Prior to Admission medications   Medication Sig Start Date End Date Taking? Authorizing Provider  DULoxetine  (CYMBALTA ) 60 MG capsule Take 1 capsule (60 mg total) by mouth daily. 03/30/23  Yes Jodie Lavern CROME, MD  levothyroxine  (SYNTHROID ) 50 MCG tablet Take 1 tablet (50 mcg total) by mouth daily. 02/13/22  Yes Jodie Lavern CROME, MD  metFORMIN (GLUCOPHAGE) 500 MG tablet Take 2 tablets by mouth 2 (two) times daily.   Yes [provider]  Na Sulfate-K Sulfate-Mg Sulf 17.5-3.13-1.6 GM/177ML SOLN See admin instructions. 05/04/23  Yes [provider]  OVER THE COUNTER MEDICATION Menopause suppliment red clover   Yes [provider]  pantoprazole (PROTONIX) 20 MG tablet Take 20 mg by mouth daily.   Yes [provider]  pregabalin  (LYRICA ) 200 MG capsule Take 1 capsule (200 mg total) by mouth 2 (two) times daily. 05/08/22  Yes Jodie Lavern CROME, MD  propranolol  ER (INDERAL  LA) 80 MG 24 hr  capsule TAKE 1 CAPSULE BY MOUTH EVERY DAY FOR 30 DAYS 04/30/23  Yes Jodie Lavern CROME, MD  SUMAtriptan  (IMITREX ) 50 MG tablet TAKE 1 TABLET BY MOUTH ONCE FOR 1 DOSE. AS NEEDED FOR MIGRAINE. MAY REPEAT DOSE IN 2 HOURS IF NEEDED 07/29/22   Jodie Lavern CROME, MD  Hyoscyamine  Sulfate SL (LEVSIN AMIEL) 0.125 MG SUBL Place 0.125 mg under the tongue 2 (two) times daily as needed. As needed for bloating and cramping Patient not taking: Reported on 04/22/2020 04/08/20 05/04/20  Jodie Lavern CROME, MD    Current Outpatient Medications  Medication Sig Dispense Refill   DULoxetine  (CYMBALTA ) 60 MG capsule Take 1 capsule (60 mg total) by mouth daily. 90 capsule 3   levothyroxine  (SYNTHROID ) 50 MCG tablet Take 1 tablet (50 mcg total) by mouth daily. 90 tablet 3   metFORMIN (GLUCOPHAGE) 500 MG tablet Take 2 tablets by mouth 2 (two) times daily.     Na Sulfate-K Sulfate-Mg Sulf 17.5-3.13-1.6 GM/177ML SOLN See admin instructions.     OVER THE COUNTER MEDICATION Menopause suppliment red clover     pantoprazole (PROTONIX) 20 MG tablet Take 20 mg by mouth daily.     pregabalin  (LYRICA ) 200 MG capsule Take 1 capsule (200 mg total) by mouth 2 (two) times daily. 60 capsule 5   propranolol  ER (INDERAL  LA) 80 MG 24 hr capsule TAKE 1 CAPSULE BY MOUTH EVERY DAY FOR 30 DAYS 90 capsule 1   SUMAtriptan  (IMITREX ) 50 MG tablet  TAKE 1 TABLET BY MOUTH ONCE FOR 1 DOSE. AS NEEDED FOR MIGRAINE. MAY REPEAT DOSE IN 2 HOURS IF NEEDED 10 tablet 2   Current Facility-Administered Medications  Medication Dose Route Frequency Provider Last Rate Last Admin   0.9 %  sodium chloride  infusion  500 mL Intravenous Once Keili Hasten, Elspeth SQUIBB, MD        Allergies as of 05/10/2023 - Review Complete 05/10/2023  Allergen Reaction Noted   Levsin  [hyoscyamine ] Other (See Comments) 05/04/2020    Family History  Problem Relation Age of Onset   Colon polyps Sister    Colon cancer Sister    Lung cancer Maternal Grandmother    Breast cancer Neg Hx    Esophageal  cancer Neg Hx    Rectal cancer Neg Hx    Stomach cancer Neg Hx     Social History   Socioeconomic History   Marital status: Married    Spouse name: Environmental Consultant   Number of children: Not on file   Years of education: Not on file   Highest education level: Not on file  Occupational History   Not on file  Tobacco Use   Smoking status: Some Days    Types: Cigarettes   Smokeless tobacco: Never  Vaping Use   Vaping status: Never Used  Substance and Sexual Activity   Alcohol use: Not Currently   Drug use: Never   Sexual activity: Not Currently    Partners: Male    Birth control/protection: Other-see comments    Comment: perimenopause  Other Topics Concern   Not on file  Social History Narrative   Not on file   Social Drivers of Health   Financial Resource Strain: Low Risk  (10/15/2022)   Received from [facility], [facility]   Overall Financial Resource Strain (CARDIA)    Difficulty of Paying Living Expenses: Not very hard  Food Insecurity: No Food Insecurity (10/15/2022)   Received from [facility], [facility]   Hunger Vital Sign    Worried About Running Out of Food in the Last Year: Never true    Ran Out of Food in the Last Year: Never true  Transportation Needs: No Transportation Needs (10/15/2022)   Received from [facility], [facility]   PRAPARE - Transportation    Lack of Transportation (Medical): No    Lack of Transportation (Non-Medical): No  Physical Activity: Not on file  Stress: Not on file  Social Connections: Unknown (08/25/2022)   Received from [facility], [facility]   Social Network    Social Network: Not on file  Intimate Partner Violence: Unknown (08/25/2022)   Received from [facility], [facility]   HITS    Physically Hurt: Not on file    Insult or Talk Down To: Not on file    Threaten Physical Harm: Not on file    Scream or Curse: Not on file    Review of Systems: All other review of systems negative except as  mentioned in the HPI.  Physical Exam: Vital signs BP 132/88   Pulse 64   Temp 98.4 F (36.9 C) (Temporal)   Ht 5' 3 (1.6 m)   Wt 220 lb (99.8 kg)   LMP  (LMP Unknown) Comment: Mirena  SpO2 95%   BMI 38.97 kg/m   General:   Alert,  Well-developed, pleasant and cooperative in NAD Lungs:  Clear throughout to auscultation.   Heart:  Regular rate and rhythm Abdomen:  Soft, nontender and nondistended.   Neuro/Psych:  Alert and cooperative. Normal mood and affect. A and O x 3  Marcey Naval, MD [facility]

## 2023-05-10 NOTE — Progress Notes (Signed)
 Called to room to assist during endoscopic procedure.  Patient ID and intended procedure confirmed with present staff. Received instructions for my participation in the procedure from the performing physician.

## 2023-05-10 NOTE — Patient Instructions (Signed)
 Resume previous diet Continue present medications Await pathology results Handouts/information given for polyps and hemorrhoids  YOU HAD AN ENDOSCOPIC PROCEDURE TODAY AT THE [facility]:   Refer to the procedure report that was given to you for any specific questions about what was found during the examination.  If the procedure report does not answer your questions, please call your gastroenterologist to clarify.  If you requested that your care partner not be given the details of your procedure findings, then the procedure report has been included in a sealed envelope for you to review at your convenience later.  YOU SHOULD EXPECT: Some feelings of bloating in the abdomen. Passage of more gas than usual.  Walking can help get rid of the air that was put into your GI tract during the procedure and reduce the bloating. If you had a lower endoscopy (such as a colonoscopy or flexible sigmoidoscopy) you may notice spotting of blood in your stool or on the toilet paper. If you underwent a bowel prep for your procedure, you may not have a normal bowel movement for a few days.  Please Note:  You might notice some irritation and congestion in your nose or some drainage.  This is from the oxygen used during your procedure.  There is no need for concern and it should clear up in a day or so.  SYMPTOMS TO REPORT IMMEDIATELY:  Following lower endoscopy (colonoscopy):  Excessive amounts of blood in the stool  Significant tenderness or worsening of abdominal pains  Swelling of the abdomen that is new, acute  Fever of 100F or higher  For urgent or emergent issues, a gastroenterologist can be reached at any hour by calling [phone]. Do not use MyChart messaging for urgent concerns.   DIET:  We do recommend a small meal at first, but then you may proceed to your regular diet.  Drink plenty of fluids but you should avoid alcoholic beverages for 24 hours.  ACTIVITY:  You should plan to take  it easy for the rest of today and you should NOT DRIVE or use heavy machinery until tomorrow (because of the sedation medicines used during the test).    FOLLOW UP: Our staff will call the number listed on your records the next business day following your procedure.  We will call around 7:15- 8:00 am to check on you and address any questions or concerns that you may have regarding the information given to you following your procedure. If we do not reach you, we will leave a message.     If any biopsies were taken you will be contacted by phone or by letter within the next 1-3 weeks.  Please call us  at [phone] if you have not heard about the biopsies in 3 weeks.    SIGNATURES/CONFIDENTIALITY: You and/or your care partner have signed paperwork which will be entered into your electronic medical record.  These signatures attest to the fact that that the information above on your After Visit Summary has been reviewed and is understood.  Full responsibility of the confidentiality of this discharge information lies with you and/or your care-partner.

## 2023-05-11 ENCOUNTER — Telehealth: Payer: Self-pay

## 2023-05-11 NOTE — Telephone Encounter (Signed)
  Follow up Call-     05/10/2023    1:56 PM  Call back number  Post procedure Call Back phone  # [phone]- husband  Permission to leave phone message Yes     Patient questions:  Do you have a fever, pain , or abdominal swelling? No. Pain Score  0 *  Have you tolerated food without any problems? Yes.    Have you been able to return to your normal activities? Yes.    Do you have any questions about your discharge instructions: Diet   No. Medications  No. Follow up visit  No.  Do you have questions or concerns about your Care? No.  Actions: * If pain score is 4 or above: No action needed, pain <4.

## 2023-05-13 LAB — SURGICAL PATHOLOGY

## 2023-05-14 ENCOUNTER — Encounter: Payer: Self-pay | Admitting: Gastroenterology

## 2023-05-20 ENCOUNTER — Other Ambulatory Visit: Payer: Self-pay | Admitting: Family Medicine

## 2023-05-20 NOTE — Telephone Encounter (Signed)
Copied from CRM [id number]. Topic: Clinical - Medication Refill >> May 20, 2023  6:01 PM Denese Killings wrote: Most Recent Primary Care Visit:  Provider: Willow Ora  Department: [facility]  Visit Type: PHYSICAL  Date: 04/07/2023  Medication: pregabalin (LYRICA) 200 MG capsule  Has the patient contacted their pharmacy? Yes (Agent: If no, request that the patient contact the pharmacy for the refill. If patient does not wish to contact the pharmacy document the reason why and proceed with request.) (Agent: If yes, when and what did the pharmacy advise?) pt was advised that pharmacy sent refill request  Is this the correct pharmacy for this prescription? Yes If no, delete pharmacy and type the correct one.  This is the patient's preferred pharmacy:  [id number] [place], [place] - [address] [place] [address] Phone: [phone] Fax: [phone]   Has the prescription been filled recently? Yes  Is the patient out of the medication? No 2 pills   Has the patient been seen for an appointment in the last year OR does the patient have an upcoming appointment? Yes  Can we respond through MyChart? Yes  Agent: Please be advised that Rx refills may take up to 3 business days. We ask that you follow-up with your pharmacy.

## 2023-05-21 MED ORDER — PREGABALIN 200 MG PO CAPS: 200.0000 mg | ORAL_CAPSULE | Freq: Two times a day (BID) | ORAL | 5 refills | Status: DC

## 2023-05-24 ENCOUNTER — Encounter: Payer: Self-pay | Admitting: Family Medicine

## 2023-05-24 DIAGNOSIS — D126 Benign neoplasm of colon, unspecified: Secondary | ICD-10-CM | POA: Insufficient documentation

## 2023-05-26 ENCOUNTER — Other Ambulatory Visit: Payer: Self-pay | Admitting: Family Medicine

## 2023-05-26 DIAGNOSIS — Z1231 Encounter for screening mammogram for malignant neoplasm of breast: Secondary | ICD-10-CM

## 2023-05-28 ENCOUNTER — Other Ambulatory Visit: Payer: Self-pay | Admitting: Family Medicine

## 2023-05-28 DIAGNOSIS — Z1231 Encounter for screening mammogram for malignant neoplasm of breast: Secondary | ICD-10-CM

## 2023-06-16 DIAGNOSIS — Z1231 Encounter for screening mammogram for malignant neoplasm of breast: Secondary | ICD-10-CM

## 2023-06-18 ENCOUNTER — Ambulatory Visit
Admission: RE | Admit: 2023-06-18 | Discharge: 2023-06-18 | Disposition: A | Discharge status: Home / Self Care | Payer: BC Managed Care – PPO | Source: Ambulatory Visit | Attending: Family Medicine | Admitting: Family Medicine

## 2023-06-18 DIAGNOSIS — Z1231 Encounter for screening mammogram for malignant neoplasm of breast: Secondary | ICD-10-CM

## 2023-08-16 ENCOUNTER — Other Ambulatory Visit: Payer: Self-pay | Admitting: Family Medicine

## 2023-08-16 DIAGNOSIS — E039 Hypothyroidism, unspecified: Secondary | ICD-10-CM

## 2023-08-16 MED ORDER — LEVOTHYROXINE SODIUM 50 MCG PO TABS: 50.0000 ug | ORAL_TABLET | Freq: Every day | ORAL | 3 refills | Status: AC

## 2023-08-16 NOTE — Telephone Encounter (Signed)
 Copied from CRM [id number]. Topic: Clinical - Medication Refill >> Aug 16, 2023 12:05 PM Lizabeth Riggs wrote: Most Recent Primary Care Visit:  Provider: Luevenia Saha  Department: [facility]  Visit Type: PHYSICAL  Date: 04/07/2023  Medication: levothyroxine  (SYNTHROID ) 50 MCG tablet - 90 day supply  Has the patient contacted their pharmacy? Yes (Agent: If no, request that the patient contact the pharmacy for the refill. If patient does not wish to contact the pharmacy document the reason why and proceed with request.) (Agent: If yes, when and what did the pharmacy advise?) Pharmacy needs order to refill  Is this the correct pharmacy for this prescription? Yes If no, delete pharmacy and type the correct one.  This is the patient's preferred pharmacy:  [id number] [place], [place] - [address] [place] [address] Phone: [phone] Fax: [phone]  Has the prescription been filled recently? No  Is the patient out of the medication? Yes She took last one today  Has the patient been seen for an appointment in the last year OR does the patient have an upcoming appointment? Yes  Can we respond through MyChart? Yes  Agent: Please be advised that Rx refills may take up to 3 business days. We ask that you follow-up with your pharmacy.

## 2023-10-19 ENCOUNTER — Ambulatory Visit: Admitting: Family Medicine

## 2023-10-19 ENCOUNTER — Encounter: Payer: Self-pay | Admitting: Family Medicine

## 2023-10-19 VITALS — BP 125/85 | HR 75 | Temp 97.8°F | Ht 63.0 in | Wt 197.2 lb

## 2023-10-19 DIAGNOSIS — F339 Major depressive disorder, recurrent, unspecified: Secondary | ICD-10-CM

## 2023-10-19 DIAGNOSIS — Z630 Problems in relationship with spouse or partner: Secondary | ICD-10-CM

## 2023-10-19 DIAGNOSIS — K5900 Constipation, unspecified: Secondary | ICD-10-CM

## 2023-10-19 NOTE — Patient Instructions (Signed)
 Please return in December for your annual complete physical; please come fasting.   If you have any questions or concerns, please don't hesitate to send me a message via MyChart or call the office at [phone]. Thank you for visiting with us  today! It's our pleasure caring for you.   Please call [facility] to schedule an appointment with Dr. Hollace; she is a therapist here at our [place] office.  The phone number is: [phone]  Florastar once a day to help your bowel movements

## 2023-10-19 NOTE — Progress Notes (Signed)
 Subjective  CC:  Chief Complaint  Patient presents with   Abdominal Pain   Depression    HPI: Lindsay Dennis is a 53 y.o. female who presents to the office today to address the problems listed above in the chief complaint. 53 year old here for mood follow-up.  Unfortunately she has not.  She reports her husband drinks daily, the relationship is estranged.  She feels isolated and lonely.  Fortunately she is at work now part-time due to chronic back pain.  However this is helpful to her.  Also she is down because her dog is sick and surgeries are expensive.  She is on Cymbalta  which she feels is helpful.  She denies panic attacks.  She denies suicidality.  Ideally she would like to go back to [place] but she cannot bring her dog because the dog cannot fly.  She is struggling with some of these life decisions. Also complains of intermittent constipation.  Reviewed recent colonoscopy showing 1 polyp.  She has a gastric bypass status and had been on metformin from a doctor in [place].  She reports this was due to prediabetes.  However her sugars here are in the mild impaired fasting range.  Assessment  1. Major depression, recurrent, chronic (HCC)   2. Marital dysfunction   3. Constipation, unspecified constipation type      Plan  Depression and marital dysfunction: I recommend counseling.  #2 behavioral therapy this provided.  Continue Cymbalta  Constipation intermittent.  Trial of Florastor 1 daily.  Stop metformin.  No current indication.  Patient agrees  Follow up: December for complete physical 04/07/2024  No orders of the defined types were placed in this encounter.  No orders of the defined types were placed in this encounter.     I reviewed the patients updated PMH, FH, and SocHx.    Patient Active Problem List   Diagnosis Date Noted   Tubular adenoma of colon 05/24/2023    Priority: High   Family history of colon cancer 08/14/2021    Priority: High   Low back pain  05/05/2021    Priority: High   Hormone replacement therapy (HRT) 04/03/2021    Priority: High   Essential hypertension 10/09/2019    Priority: High   Acquired hypothyroidism 10/09/2019    Priority: High   Hepatitis B carrier (HCC) 04/13/2023    Priority: Medium    Gastroesophageal reflux disease without esophagitis 04/17/2021    Priority: Medium    Major depression, recurrent, chronic (HCC) 10/09/2019    Priority: Medium    Migraine headache without aura 10/09/2019    Priority: Medium    Gastric bypass status for obesity 10/09/2019    Priority: Medium    Vitamin B12 deficiency 11/09/2019    Priority: Low   IUD (intrauterine device) in place 10/09/2019    Priority: Low   Elevated CA 19-9 level 04/13/2023   Galactorrhea on left side 11/13/2021   Spouse of alcoholic 10/13/2021   Quit consuming alcohol in remote past 10/13/2021   Current Meds  Medication Sig   DULoxetine  (CYMBALTA ) 60 MG capsule Take 1 capsule (60 mg total) by mouth daily.   levothyroxine  (SYNTHROID ) 50 MCG tablet Take 1 tablet (50 mcg total) by mouth daily.   metFORMIN (GLUCOPHAGE) 500 MG tablet Take 2 tablets by mouth 2 (two) times daily.   OVER THE COUNTER MEDICATION Menopause suppliment red clover   pantoprazole (PROTONIX) 20 MG tablet Take 20 mg by mouth daily.   pregabalin  (LYRICA ) 200 MG capsule Take  1 capsule (200 mg total) by mouth 2 (two) times daily.   propranolol  ER (INDERAL  LA) 80 MG 24 hr capsule TAKE 1 CAPSULE BY MOUTH EVERY DAY FOR 30 DAYS   SUMAtriptan (IMITREX) 50 MG tablet TAKE 1 TABLET BY MOUTH ONCE FOR 1 DOSE. AS NEEDED FOR MIGRAINE. MAY REPEAT DOSE IN 2 HOURS IF NEEDED    Allergies: Patient is allergic to levsin  [hyoscyamine ]. Family History: Patient family history includes Colon cancer in her sister; Colon polyps in her sister; Lung cancer in her maternal grandmother. Social History:  Patient  reports that she has been smoking cigarettes. She has never used smokeless tobacco. She reports  that she does not currently use alcohol. She reports that she does not use drugs.  Review of Systems: Constitutional: Negative for fever malaise or anorexia Cardiovascular: negative for chest pain Respiratory: negative for SOB or persistent cough Gastrointestinal: negative for abdominal pain  Objective  Vitals: BP 125/85   Pulse 75   Temp 97.8 F (36.6 C)   Ht 5' 3 (1.6 m)   Wt 197 lb 3.2 oz (89.4 kg)   SpO2 99%   BMI 34.93 kg/m  General: no acute distress , A&Ox3 Psych: Normal affect, good insight.  Well-groomed  Commons side effects, risks, benefits, and alternatives for medications and treatment plan prescribed today were discussed, and the patient expressed understanding of the given instructions. Patient is instructed to call or message via MyChart if he/she has any questions or concerns regarding our treatment plan. No barriers to understanding were identified. We discussed Red Flag symptoms and signs in detail. Patient expressed understanding regarding what to do in case of urgent or emergency type symptoms.  Medication list was reconciled, printed and provided to the patient in AVS. Patient instructions and summary information was reviewed with the patient as documented in the AVS. This note was prepared with assistance of Dragon voice recognition software. Occasional wrong-word or sound-a-like substitutions may have occurred due to the inherent limitations of voice recognition software

## 2023-10-26 ENCOUNTER — Other Ambulatory Visit: Payer: Self-pay | Admitting: Family Medicine

## 2023-10-26 DIAGNOSIS — I10 Essential (primary) hypertension: Secondary | ICD-10-CM

## 2024-02-15 ENCOUNTER — Ambulatory Visit: Admitting: Family Medicine

## 2024-02-15 ENCOUNTER — Encounter: Payer: Self-pay | Admitting: Family Medicine

## 2024-02-15 VITALS — BP 130/86 | HR 69 | Temp 97.8°F | Ht 63.0 in | Wt 203.4 lb

## 2024-02-15 DIAGNOSIS — R051 Acute cough: Secondary | ICD-10-CM

## 2024-02-15 DIAGNOSIS — U071 COVID-19: Secondary | ICD-10-CM | POA: Diagnosis not present

## 2024-02-15 DIAGNOSIS — R509 Fever, unspecified: Secondary | ICD-10-CM | POA: Diagnosis not present

## 2024-02-15 DIAGNOSIS — G43009 Migraine without aura, not intractable, without status migrainosus: Secondary | ICD-10-CM

## 2024-02-15 LAB — POC COVID19 BINAXNOW: SARS Coronavirus 2 Ag: POSITIVE — AB

## 2024-02-15 MED ORDER — SUMATRIPTAN SUCCINATE 50 MG PO TABS: 50.0000 mg | ORAL_TABLET | Freq: Once | ORAL | 2 refills | Status: AC | PRN

## 2024-02-17 NOTE — Progress Notes (Signed)
 Subjective  CC:  Chief Complaint  Patient presents with   Cough    Pt stated that she has had a cough along with a fever for the past 3 days and has gotten worse     HPI: Lindsay Dennis is a 53 y.o. female who presents to the office today to address the problems listed above in the chief complaint. Discussed the use of AI scribe software for clinical note transcription with the patient, who gave verbal consent to proceed.  History of Present Illness Lindsay Dennis is a 54 year old female who presents with COVID-19 symptoms.  Acute viral syndrome (covid-19) - Symptoms include fever, body aches, and cough - Describes feeling 'very bad' - Concerned about impact on her busy life and ability to return to work at [employer] - Taking Tylenol  for fever - Inquires about timing for safe return to work  Musculoskeletal pain - Experiencing back pain, attributed to current illness - Using Advil for body aches and back pain  Knee injury - Recently underwent MRA of the knee in [place] per orthopedic recommendation - Diagnosed with a torn meniscus - Advised to undergo surgery for meniscal tear - Traveled to [place] for affordability with her insurance  Migraine headaches - History of migraines requiring medication management - Requests prescription for sumatriptan due to running out of current supply  Rosacea - History of rosacea   Assessment  1. COVID-19   2. Acute cough   3. Fever, unspecified fever cause   4. Migraine without aura and without status migrainosus, not intractable      Plan  Assessment and Plan Assessment & Plan COVID-19 infection Acute COVID-19 infection confirmed by positive test. Symptoms include fever, body aches, and cough. Oxygen levels are stable, and no fever is present at the time of examination. Expected to recover in a few days with supportive care. - Advise rest and increased fluid intake - Recommend acetaminophen  for fever and body  aches - Advise over-the-counter cough and cold medicine - Advise return to work five days after symptom onset, which is Thursday - Provide work note as requested  Migraine Reports running out of sumatriptan, which is used for migraine management. - Prescribe sumatriptan  F/u with ortho for meniscus     Follow up: prn Orders Placed This Encounter  Procedures   POC COVID-19   Meds ordered this encounter  Medications   SUMAtriptan (IMITREX) 50 MG tablet    Sig: Take 1 tablet (50 mg total) by mouth once as needed for up to 1 dose for migraine. May repeat in 2 hours if headache persists or recurs.    Dispense:  12 tablet    Refill:  2     I reviewed the patients updated PMH, FH, and SocHx.  Patient Active Problem List   Diagnosis Date Noted   Tubular adenoma of colon 05/24/2023    Priority: High   Family history of colon cancer 08/14/2021    Priority: High   Low back pain 05/05/2021    Priority: High   Hormone replacement therapy (HRT) 04/03/2021    Priority: High   Essential hypertension 10/09/2019    Priority: High   Acquired hypothyroidism 10/09/2019    Priority: High   Hepatitis B carrier (HCC) 04/13/2023    Priority: Medium    Gastroesophageal reflux disease without esophagitis 04/17/2021    Priority: Medium    Major depression, recurrent, chronic 10/09/2019    Priority: Medium    Migraine headache without aura  10/09/2019    Priority: Medium    Gastric bypass status for obesity 10/09/2019    Priority: Medium    Vitamin B12 deficiency 11/09/2019    Priority: Low   IUD (intrauterine device) in place 10/09/2019    Priority: Low   Elevated CA 19-9 level 04/13/2023   Galactorrhea on left side 11/13/2021   Spouse of alcoholic 10/13/2021   Quit consuming alcohol in remote past 10/13/2021   Current Meds  Medication Sig   DULoxetine  (CYMBALTA ) 60 MG capsule Take 1 capsule (60 mg total) by mouth daily.   levothyroxine  (SYNTHROID ) 50 MCG tablet Take 1 tablet (50  mcg total) by mouth daily.   OVER THE COUNTER MEDICATION Menopause suppliment red clover   pantoprazole (PROTONIX) 20 MG tablet Take 20 mg by mouth daily.   pregabalin  (LYRICA ) 200 MG capsule Take 1 capsule (200 mg total) by mouth 2 (two) times daily.   propranolol  ER (INDERAL  LA) 80 MG 24 hr capsule TAKE 1 CAPSULE BY MOUTH EVERY DAY FOR 30 DAYS   SUMAtriptan (IMITREX) 50 MG tablet Take 1 tablet (50 mg total) by mouth once as needed for up to 1 dose for migraine. May repeat in 2 hours if headache persists or recurs.   [DISCONTINUED] SUMAtriptan (IMITREX) 50 MG tablet TAKE 1 TABLET BY MOUTH ONCE FOR 1 DOSE. AS NEEDED FOR MIGRAINE. MAY REPEAT DOSE IN 2 HOURS IF NEEDED   Allergies: Patient is allergic to levsin  [hyoscyamine ]. Family History: Patient family history includes Colon cancer in her sister; Colon polyps in her sister; Lung cancer in her maternal grandmother. Social History:  Patient  reports that she has been smoking cigarettes. She has never used smokeless tobacco. She reports that she does not currently use alcohol. She reports that she does not use drugs.  Review of Systems: Constitutional: Negative for fever malaise or anorexia Cardiovascular: negative for chest pain Respiratory: negative for SOB or persistent cough Gastrointestinal: negative for abdominal pain  Objective  Vitals: BP 130/86   Pulse 69   Temp 97.8 F (36.6 C)   Ht 5' 3 (1.6 m)   Wt 203 lb 6.4 oz (92.3 kg)   SpO2 99%   BMI 36.03 kg/m  General: no acute distress , A&Ox3 HEENT: PEERL, conjunctiva normal, neck is supple Cardiovascular:  RRR without murmur or gallop.  Respiratory:  Good breath sounds bilaterally, CTAB with normal respiratory effort Skin:  Warm, no rashes Commons side effects, risks, benefits, and alternatives for medications and treatment plan prescribed today were discussed, and the patient expressed understanding of the given instructions. Patient is instructed to call or message via  MyChart if he/she has any questions or concerns regarding our treatment plan. No barriers to understanding were identified. We discussed Red Flag symptoms and signs in detail. Patient expressed understanding regarding what to do in case of urgent or emergency type symptoms.  Medication list was reconciled, printed and provided to the patient in AVS. Patient instructions and summary information was reviewed with the patient as documented in the AVS. This note was prepared with assistance of Dragon voice recognition software. Occasional wrong-word or sound-a-like substitutions may have occurred due to the inherent limitations of voice recognition software

## 2024-03-09 ENCOUNTER — Other Ambulatory Visit: Payer: Self-pay | Admitting: Family Medicine

## 2024-03-09 NOTE — Telephone Encounter (Signed)
 02/15/2024 LOV  05/21/2023 fill date  60/5 refills

## 2024-03-22 ENCOUNTER — Other Ambulatory Visit: Payer: Self-pay | Admitting: Family Medicine

## 2024-04-03 ENCOUNTER — Ambulatory Visit: Payer: Self-pay

## 2024-04-03 NOTE — Telephone Encounter (Signed)
 FYI Only or Action Required?: FYI only for provider: appointment scheduled on 04/04/2024.  Patient was last seen in primary care on 02/15/2024 by Jodie Lavern CROME, MD.  Called Nurse Triage reporting Shortness of Breath.  Symptoms began several days ago.  Interventions attempted: Nothing.  Symptoms are: unchanged.  Triage Disposition: SEE PCP IN 24 HOURS  Patient/caregiver understands and will follow disposition?: Yes  Copied from CRM [id number]. Topic: Clinical - Red Word Triage >> Apr 03, 2024  9:43 AM Alfonso ORN wrote: Red Word that prompted transfer to Nurse Triage: pt husband stated pt has had heavier breathing that has gotten worse over the past couple days. Reason for Disposition  [1] MILD difficulty breathing (e.g., minimal/no SOB at rest, SOB with walking, pulse < 100) AND [2] NEW-onset or WORSE than normal    Describes mild SOB after coughing, Dispo to see provider in 24 hours  Answer Assessment - Initial Assessment Questions Husband is caller  1. RESPIRATORY STATUS: Describe your breathing? (e.g., wheezing, shortness of breath, unable to speak, severe coughing)  Non productive cough.  Sometimes difficulty breathing after cough 2. ONSET: When did this breathing problem begin?     Saturday, two days ago 3. PATTERN Does the difficult breathing come and go, or has it been constant since it started?      Comes and goes 4. SEVERITY: How bad is your breathing? (e.g., mild, moderate, severe)      mild 5. RECURRENT SYMPTOM: Have you had difficulty breathing before? If Yes, ask: When was the last time? and What happened that time?      unknown 6. CARDIAC HISTORY: Do you have any history of heart disease? (e.g., heart attack, angina, bypass surgery, angioplasty)      denies 7. LUNG HISTORY: Do you have any history of lung disease?  (e.g., pulmonary embolus, asthma, emphysema)     denies 8. CAUSE: What do you think is causing the breathing problem?      unknown 9.  OTHER SYMPTOMS: Do you have any other symptoms? (e.g., chest pain, cough, dizziness, fever, runny nose)     Feels weak, gets tired faster 10. O2 SATURATION MONITOR:  Do you use an oxygen saturation monitor (pulse oximeter) at home? If Yes, ask: What is your reading (oxygen level) today? What is your usual oxygen saturation reading? (e.g., 95%)       Not monitored  Protocols used: Breathing Difficulty-A-AH

## 2024-04-03 NOTE — Telephone Encounter (Signed)
 Noted pt scheduled for appt with PCP

## 2024-04-04 ENCOUNTER — Ambulatory Visit: Admitting: Family Medicine

## 2024-04-07 ENCOUNTER — Encounter: Admitting: Family Medicine

## 2024-04-19 ENCOUNTER — Other Ambulatory Visit: Payer: Self-pay | Admitting: Family Medicine

## 2024-04-19 DIAGNOSIS — I10 Essential (primary) hypertension: Secondary | ICD-10-CM

## 2024-05-10 ENCOUNTER — Encounter: Admitting: Family Medicine

## 2024-05-18 ENCOUNTER — Ambulatory Visit (INDEPENDENT_AMBULATORY_CARE_PROVIDER_SITE_OTHER): Payer: Self-pay | Admitting: Family Medicine

## 2024-05-18 ENCOUNTER — Encounter: Payer: Self-pay | Admitting: Family Medicine

## 2024-05-18 VITALS — BP 124/86 | HR 74 | Temp 97.7°F | Ht 63.0 in | Wt 211.4 lb

## 2024-05-18 DIAGNOSIS — R6883 Chills (without fever): Secondary | ICD-10-CM

## 2024-05-18 DIAGNOSIS — I10 Essential (primary) hypertension: Secondary | ICD-10-CM | POA: Diagnosis not present

## 2024-05-18 DIAGNOSIS — G43009 Migraine without aura, not intractable, without status migrainosus: Secondary | ICD-10-CM

## 2024-05-18 DIAGNOSIS — Z9884 Bariatric surgery status: Secondary | ICD-10-CM | POA: Diagnosis not present

## 2024-05-18 DIAGNOSIS — Z0001 Encounter for general adult medical examination with abnormal findings: Secondary | ICD-10-CM

## 2024-05-18 DIAGNOSIS — Z23 Encounter for immunization: Secondary | ICD-10-CM

## 2024-05-18 DIAGNOSIS — Z800 Family history of malignant neoplasm of digestive organs: Secondary | ICD-10-CM | POA: Diagnosis not present

## 2024-05-18 DIAGNOSIS — E538 Deficiency of other specified B group vitamins: Secondary | ICD-10-CM | POA: Diagnosis not present

## 2024-05-18 DIAGNOSIS — E039 Hypothyroidism, unspecified: Secondary | ICD-10-CM | POA: Diagnosis not present

## 2024-05-18 DIAGNOSIS — F339 Major depressive disorder, recurrent, unspecified: Secondary | ICD-10-CM | POA: Diagnosis not present

## 2024-05-18 LAB — LIPID PANEL
Cholesterol: 249 mg/dL — ABNORMAL HIGH (ref 28–200)
HDL: 56.4 mg/dL
LDL Cholesterol: 170 mg/dL — ABNORMAL HIGH (ref 10–99)
NonHDL: 192.31
Total CHOL/HDL Ratio: 4
Triglycerides: 114 mg/dL (ref 10.0–149.0)
VLDL: 22.8 mg/dL (ref 0.0–40.0)

## 2024-05-18 LAB — COMPREHENSIVE METABOLIC PANEL WITH GFR
ALT: 9 U/L (ref 3–35)
AST: 10 U/L (ref 5–37)
Albumin: 3.7 g/dL (ref 3.5–5.2)
Alkaline Phosphatase: 34 U/L — ABNORMAL LOW (ref 39–117)
BUN: 18 mg/dL (ref 6–23)
CO2: 27 meq/L (ref 19–32)
Calcium: 8.8 mg/dL (ref 8.4–10.5)
Chloride: 104 meq/L (ref 96–112)
Creatinine, Ser: 0.77 mg/dL (ref 0.40–1.20)
GFR: 88.55 mL/min
Glucose, Bld: 86 mg/dL (ref 70–99)
Potassium: 4 meq/L (ref 3.5–5.1)
Sodium: 139 meq/L (ref 135–145)
Total Bilirubin: 0.3 mg/dL (ref 0.2–1.2)
Total Protein: 6.7 g/dL (ref 6.0–8.3)

## 2024-05-18 LAB — VITAMIN D 25 HYDROXY (VIT D DEFICIENCY, FRACTURES): VITD: 26.52 ng/mL — ABNORMAL LOW (ref 30.00–100.00)

## 2024-05-18 LAB — CBC WITH DIFFERENTIAL/PLATELET
Basophils Absolute: 0 K/uL (ref 0.0–0.1)
Basophils Relative: 0.4 % (ref 0.0–3.0)
Eosinophils Absolute: 0.1 K/uL (ref 0.0–0.7)
Eosinophils Relative: 2.1 % (ref 0.0–5.0)
HCT: 35.6 % — ABNORMAL LOW (ref 36.0–46.0)
Hemoglobin: 11.6 g/dL — ABNORMAL LOW (ref 12.0–15.0)
Lymphocytes Relative: 32.3 % (ref 12.0–46.0)
Lymphs Abs: 2.1 K/uL (ref 0.7–4.0)
MCHC: 32.5 g/dL (ref 30.0–36.0)
MCV: 78.7 fl (ref 78.0–100.0)
Monocytes Absolute: 0.5 K/uL (ref 0.1–1.0)
Monocytes Relative: 7.7 % (ref 3.0–12.0)
Neutro Abs: 3.7 K/uL (ref 1.4–7.7)
Neutrophils Relative %: 57.5 % (ref 43.0–77.0)
Platelets: 282 K/uL (ref 150.0–400.0)
RBC: 4.53 Mil/uL (ref 3.87–5.11)
RDW: 17.7 % — ABNORMAL HIGH (ref 11.5–15.5)
WBC: 6.4 K/uL (ref 4.0–10.5)

## 2024-05-18 LAB — TSH: TSH: 1.49 u[IU]/mL (ref 0.35–5.50)

## 2024-05-18 LAB — VITAMIN B12: Vitamin B-12: 451 pg/mL (ref 211–911)

## 2024-05-18 MED ORDER — PROPRANOLOL HCL ER 80 MG PO CP24: 80.0000 mg | ORAL_CAPSULE | Freq: Every day | ORAL | Status: AC

## 2024-05-18 NOTE — Patient Instructions (Signed)
 Please return in 6 months for hypertension follow up.   I will release your lab results to you on your MyChart account with further instructions. You may see the results before I do, but when I review them I will send you a message with my report or have my assistant call you if things need to be discussed. Please reply to my message with any questions. Thank you!   If you have any questions or concerns, please don't hesitate to send me a message via MyChart or call the office at [phone]. Thank you for visiting with us  today! It's our pleasure caring for you.    VISIT SUMMARY: During your visit, we discussed your ongoing knee pain, cold intolerance, migraines, nausea, hypertension, weight management, and rosacea. We also reviewed your smoking history, immunization status, and antiviral prophylaxis.  YOUR PLAN: -OSTEOARTHRITIS OF RIGHT KNEE: Osteoarthritis is a condition where the cartilage in your knee wears down over time, causing pain and stiffness. Despite receiving six cortisone injections, your knee pain persists. We are considering surgical intervention as the next step since conservative treatments have not been effective. Continue using your knee brace for support.  -ACQUIRED HYPOTHYROIDISM: Hypothyroidism is when your thyroid  gland does not produce enough thyroid  hormone, leading to symptoms like cold intolerance. We have checked your thyroid  function and hemoglobin levels to ensure your current treatment is appropriate and to rule out anemia.  -ESSENTIAL HYPERTENSION: Hypertension is high blood pressure. Your blood pressure is currently managed with propranolol , but we aim to improve control further.  -RECURRENT HERPES SIMPLEX INFECTION: Herpes simplex is a viral infection that causes cold sores. You are taking valacyclovir daily to prevent recurrences. We administered the second shingles vaccine to complete your series and help prevent shingles.  -MIGRAINE: Migraines are severe  headaches often accompanied by nausea. We have prescribed sumatriptan  for you to use as needed to manage your migraines.  INSTRUCTIONS: Please follow up for the results of your thyroid  function and hemoglobin tests. Continue using your knee brace and consider the possibility of surgical intervention for your knee. Maintain your current medications for hypertension and herpes simplex. Use sumatriptan  as needed for migraines. Ensure you complete any remaining vaccinations as discussed.    Contains text generated by [employer]bridge.

## 2024-05-18 NOTE — Progress Notes (Signed)
 " Subjective  Chief Complaint  Patient presents with   Annual Exam    Pt here for Annual exam and is currenlty fasting. Pt stated that she stays cold all the time and she is worried if any blood test could be done to see what is going on    HPI: Lindsay Dennis is a 54 y.o. female who presents to [facility] [place] today for a Female Wellness Visit. She also has the concerns and/or needs as listed above in the chief complaint. These will be addressed in addition to the Health Maintenance Visit.   Wellness Visit: annual visit with health maintenance review and exam  HM: colonoscopy current w/ tubular adenoma done last year. Mammo due next month. Pap up to date. Doing well overall except for knee injury/meniscus problem managed by ortho and my need surgery and cold intolerance.   Chronic disease f/u and/or acute problem visit: (deemed necessary to be done in addition to the wellness visit): Discussed the use of AI scribe software for clinical note transcription with the patient, who gave verbal consent to proceed.  History of Present Illness Lindsay Dennis is a 54 year old female who presents with knee pain and cold intolerance.  Knee pain - Persistent knee pain despite six cortisone injections over the past several months - Initial injections provided relief for three to four months; recent injections less effective, with last injection two weeks ago providing relief for only two to three weeks - Continues to experience pain - Wears a brace for support  Cold intolerance and peripheral cyanosis - Significant cold intolerance, feeling cold even in a warm environment of 81F at home - Purple discoloration of the nose and cold extremities year-round - Taking 50 mcg of thyroid  medication daily with persistent symptoms  Migraine headaches - Requests refill of sumatriptan  for occasional use to manage migraines, no red flag sxs - uses prn odensatron for migraine  associated nause. Rare  Hypertension - Currently taking propranolol  for blood pressure management - Describes blood pressure control as 'okay' - monitors at home.  Feeling well. Taking medications w/o adverse effects. No symptoms of CHF, angina; no palpitations, sob, cp or lower extremity edema. Compliant with meds.    Weight management - Previously took semaglutide  for weight loss, resulting in 40 pound weight loss - Unable to afford semaglutide  currently - has regained weight.   Rosacea - History of rosacea  Smoking history - History of cigarette smoking - Has quit smoking cigarettes completely. - occ marijuana use  Immunization status and antiviral prophylaxis - Completed three doses of hepatitis B vaccine in the past due to occupation as copywriter, advertising - declines further hep b vaccines - Received one shingles vaccine, due for second to complete the series - prevnar eligibe but declines today.  Recurrent cold sores.  - Taking valacyclovir daily for three months to prevent recurrence of cold sores  Hypothyroidism: on levothyroxine  daily. Suffering from feeling cold all the time. No edema. No skin/hair changes.   Chronic major depression stable on cymbalta .   B12 deficiency due for recheck. Not taking supplements regularly.     Assessment  1. Encounter for well adult exam with abnormal findings   2. Acquired hypothyroidism   3. Essential hypertension   4. Family history of colon cancer   5. Hormone replacement therapy (HRT)   6. Gastric bypass status for obesity   7. Major depression, recurrent, chronic   8. Vitamin B12 deficiency  9. Essential (primary) hypertension   10. Chills   11. Migraine without aura and without status migrainosus, not intractable      Plan  Female Wellness Visit: Age appropriate Health Maintenance and Prevention measures were discussed with patient. Included topics are cancer screening recommendations, ways to keep healthy (see  AVS) including dietary and exercise recommendations, regular eye and dental care, use of seat belts, and avoidance of moderate alcohol use and tobacco use.  BMI: discussed patient's BMI and encouraged positive lifestyle modifications to help get to or maintain a target BMI. HM needs and immunizations were addressed and ordered. See below for orders. See HM and immunization section for updates. Shingrix  completed today. Will update prevnar in the future. Declines flu.  Routine labs and screening tests ordered including cmp, cbc and lipids where appropriate. Discussed recommendations regarding Vit D and calcium supplementation (see AVS)  Chronic disease management visit and/or acute problem visit: Assessment and Plan Assessment & Plan Meniscal injury of right knee Chronic osteoarthritis and injury of the right knee with inadequate response to six cortisone injections. Pain persists despite recent injection two weeks ago, requiring use of a knee brace. Surgical intervention is being considered due to lack of efficacy of conservative management. - Continue to consider surgical intervention for osteoarthritis of the right knee.  Acquired hypothyroidism Persistent cold intolerance despite daily levothyroxine  50 mcg. Symptoms include feeling cold in a 54F environment and cold extremities. Differential includes menopause-related changes and potential anemia. - Checked thyroid  function tests to assess current thyroid  hormone levels. - Checked hemoglobin levels to rule out anemia.  Essential hypertension Hypertension managed with propranolol . Blood pressure is well-controlled but could be improved.  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 Previous shingles vaccination series was incomplete. Discussed the importance of completing the shingles vaccine series to prevent shingles, which can cause significant pain. - Administered second shingles vaccine to complete the  series.  Migraine Migraines managed with sumatriptan  as needed. Occasional nausea associated with migraines. - Prescribed sumatriptan  for migraine management.  Depression is stable on cymbalt  Chills: check for vit deficiency. Possible raynauds. H/o gastric bypass; could be vit deficient.       Follow up: 6 mo for recheck  Orders Placed This Encounter  Procedures   TSH   VITAMIN D  25 Hydroxy (Vit-D Deficiency, Fractures)   CBC with Differential/Platelet   Comprehensive metabolic panel with GFR   Lipid panel   Vitamin B12   Iron, TIBC and Ferritin Panel   Meds ordered this encounter  Medications   propranolol  ER (INDERAL  LA) 80 MG 24 hr capsule    Sig: Take 1 capsule (80 mg total) by mouth daily.      Body mass index is 37.45 kg/m. Wt Readings from Last 3 Encounters:  05/18/24 211 lb 6.4 oz (95.9 kg)  02/15/24 203 lb 6.4 oz (92.3 kg)  10/19/23 197 lb 3.2 oz (89.4 kg)     Patient Active Problem List   Diagnosis Date Noted Date Diagnosed   Tubular adenoma of colon 05/24/2023     Priority: High    Colonoscopy 04/2023, Dr leigh, repeat in 5 years    Family history of colon cancer 08/14/2021     Priority: High   Low back pain 05/05/2021     Priority: High   Hormone replacement therapy (HRT) 04/03/2021     Priority: High   Essential hypertension 10/09/2019     Priority: High   Acquired hypothyroidism 10/09/2019     Priority:  High   Hepatitis B carrier (HCC) 04/13/2023     Priority: Medium     + hep B core ab, neg HepBSag, inactive chronic carrier state by labs 2024    Gastroesophageal reflux disease without esophagitis 04/17/2021     Priority: Medium     EGD in [place] 2022; pt reports normal. On chronic PPI    Major depression, recurrent, chronic 10/09/2019     Priority: Medium    Migraine headache without aura 10/09/2019     Priority: Medium    Gastric bypass status for obesity 10/09/2019     Priority: Medium     Gastric sleeve: 2004     Galactorrhea on left side 11/13/2021     Priority: Low    Neg mammo and utrasound; non spontaneous. Associated with fibrocystic breast changes. 10/2021    Vitamin B12 deficiency 11/09/2019     Priority: Low   IUD (intrauterine device) in place 10/09/2019     Priority: Low    mirena placed 2018 in [place].  3rd IUD    Spouse of alcoholic 10/13/2021    Quit consuming alcohol in remote past 10/13/2021    Health Maintenance  Topic Date Due   Hepatitis B Vaccines 19-59 Average Risk (2 of 2 - CpG 2-dose series) 10/05/2023   Mammogram  06/17/2024   COVID-19 Vaccine (3 - 2025-26 season) 06/03/2024 (Originally 12/27/2023)   Influenza Vaccine  07/25/2024 (Originally 11/26/2023)   Zoster Vaccines- Shingrix  (2 of 2) 08/16/2024 (Originally 06/02/2023)   Pneumococcal Vaccine: 50+ Years (1 of 2 - PCV) 05/18/2025 (Originally 09/26/1990)   Cervical Cancer Screening (HPV/Pap Cotest)  02/04/2026   Colonoscopy  05/09/2028   DTaP/Tdap/Td (2 - Td or Tdap) 04/06/2033   HPV VACCINES (No Doses Required) Completed   Hepatitis C Screening  Completed   HIV Screening  Completed   Meningococcal B Vaccine  Aged Out   Immunization History  Administered Date(s) Administered   Hepb-cpg 09/07/2023   MMR 09/07/2023   PFIZER(Purple Top)SARS-COV-2 Vaccination 08/18/2019, 09/08/2019   Tdap 04/07/2023   Zoster Recombinant(Shingrix ) 04/07/2023   We updated and reviewed the patient's past history in detail and it is documented below. Allergies: Patient is allergic to levsin  [hyoscyamine ]. Past Medical History Patient  has a past medical history of Acquired hypothyroidism (10/09/2019), Anemia, Elevated CA 19-9 level (04/13/2023), Essential hypertension (10/09/2019), GERD (gastroesophageal reflux disease), Hyperlipidemia, Major depression, recurrent, chronic (10/09/2019), and Migraine headache without aura (10/09/2019). Past Surgical History Patient  has a past surgical history that includes Sleeve Gastroplasty (2018);  Cholecystectomy (2004); and teeth removal. Family History: Patient family history includes Colon cancer in her sister; Colon polyps in her sister; Lung cancer in her maternal grandmother. Social History:  Patient  reports that she has quit smoking. Her smoking use included cigarettes. She started smoking about 6 years ago. She has a 3 pack-year smoking history. She has never used smokeless tobacco. She reports that she does not currently use alcohol. She reports that she does not use drugs.  Review of Systems: Constitutional: negative for fever or malaise Ophthalmic: negative for photophobia, double vision or loss of vision Cardiovascular: negative for chest pain, dyspnea on exertion, or new LE swelling Respiratory: negative for SOB or persistent cough Gastrointestinal: negative for abdominal pain, change in bowel habits or melena Genitourinary: negative for dysuria or gross hematuria, no abnormal uterine bleeding or disharge Musculoskeletal: negative for new gait disturbance or muscular weakness Integumentary: negative for new or persistent rashes, no breast lumps Neurological: negative for TIA or  stroke symptoms Psychiatric: negative for SI or delusions Allergic/Immunologic: negative for hives  Patient Care Team    Relationship Specialty Notifications Start End  Jodie Lavern CROME, MD PCP - General Family Medicine  02/23/23   Armbruster, Elspeth SQUIBB, MD Consulting Physician Gastroenterology  04/03/21   Diedre Rosaline BRAVO, MD Consulting Physician Obstetrics and Gynecology  04/03/21   Cristopher Suzen HERO, NP Nurse Practitioner Family Medicine  10/21/22   Delane Lye, MD Referring Physician Sports Medicine  04/07/23     Objective  Vitals: BP 124/86   Pulse 74   Temp 97.7 F (36.5 C)   Ht 5' 3 (1.6 m)   Wt 211 lb 6.4 oz (95.9 kg)   SpO2 99%   BMI 37.45 kg/m  General:  Well developed, well nourished, no acute distress  Psych:  Alert and orientedx3,normal mood and affect HEENT:   Normocephalic, atraumatic, non-icteric sclera,  supple neck without adenopathy, mass or thyromegaly Cardiovascular:  Normal S1, S2, RRR without gallop, rub or murmur Respiratory:  Good breath sounds bilaterally, CTAB with normal respiratory effort Gastrointestinal: normal bowel sounds, soft, non-tender, no noted masses. No HSM MSK: extremities without edema, joints without erythema or swelling Neurologic:    Mental status is normal.  Gross motor and sensory exams are normal.  No tremor  Commons side effects, risks, benefits, and alternatives for medications and treatment plan prescribed today were discussed, and the patient expressed understanding of the given instructions. Patient is instructed to call or message via MyChart if he/she has any questions or concerns regarding our treatment plan. No barriers to understanding were identified. We discussed Red Flag symptoms and signs in detail. Patient expressed understanding regarding what to do in case of urgent or emergency type symptoms.  Medication list was reconciled, printed and provided to the patient in AVS. Patient instructions and summary information was reviewed with the patient as documented in the AVS. This note was prepared with assistance of Dragon voice recognition software. Occasional wrong-word or sound-a-like substitutions may have occurred due to the inherent limitations of voice recognition software     "

## 2024-05-19 LAB — IRON,TIBC AND FERRITIN PANEL
%SAT: 8 % — ABNORMAL LOW (ref 16–45)
Ferritin: 7 ng/mL — ABNORMAL LOW (ref 16–232)
Iron: 30 ug/dL — ABNORMAL LOW (ref 45–160)
TIBC: 392 ug/dL (ref 250–450)

## 2024-05-22 ENCOUNTER — Ambulatory Visit: Payer: Self-pay | Admitting: Family Medicine

## 2024-05-22 NOTE — Progress Notes (Signed)
 Please call patient:pt is iron deficient and mildly anemic; this could be contributing to her feeling cold. Please verify: is she still having menstrual cycles? Start otc iron twice daily for 12 weeks. Also need vit D 4000 units daily. Other labs are fine. Recheck in office in 3 months with an appt with me.  Cholesterol levels are high again: work on [employer].   The 10-year ASCVD risk score (Arnett DK, et al., 2019) is: 2.4%   Values used to calculate the score:     Age: 54 years     Clinically relevant sex: Female     Is Non-Hispanic African American: No     Diabetic: No     Tobacco smoker: No     Systolic Blood Pressure: 124 mmHg     Is BP treated: Yes     HDL Cholesterol: 56.4 mg/dL     Total Cholesterol: 249 mg/dL

## 2025-05-21 ENCOUNTER — Encounter: Admitting: Family Medicine
